# Patient Record
Sex: Female | Born: 1940 | Race: White | Hispanic: No | Marital: Married | Smoking: Former smoker
Health system: Southern US, Community
[De-identification: ages and names within clinical notes are randomized; demographics above are authoritative.]

## PROBLEM LIST (undated history)

## (undated) DIAGNOSIS — R0600 Dyspnea, unspecified: Secondary | ICD-10-CM

## (undated) DIAGNOSIS — R000 Tachycardia, unspecified: Secondary | ICD-10-CM

## (undated) DIAGNOSIS — K219 Gastro-esophageal reflux disease without esophagitis: Secondary | ICD-10-CM

## (undated) DIAGNOSIS — Z9981 Dependence on supplemental oxygen: Secondary | ICD-10-CM

## (undated) DIAGNOSIS — J449 Chronic obstructive pulmonary disease, unspecified: Secondary | ICD-10-CM

## (undated) HISTORY — PX: APPENDECTOMY: SHX54

## (undated) HISTORY — PX: PARTIAL HYSTERECTOMY: SHX80

## (undated) HISTORY — PX: BACK SURGERY: SHX140

## (undated) HISTORY — PX: OTHER SURGICAL HISTORY: SHX169

## (undated) HISTORY — DX: Chronic obstructive pulmonary disease, unspecified: J44.9

## (undated) HISTORY — DX: Gastro-esophageal reflux disease without esophagitis: K21.9

## (undated) HISTORY — PX: FOOT SURGERY: SHX648

## (undated) HISTORY — DX: Tachycardia, unspecified: R00.0

---

## 1997-11-29 ENCOUNTER — Other Ambulatory Visit: Admission: RE | Admit: 1997-11-29 | Discharge: 1997-11-29 | Payer: Self-pay | Admitting: Family Medicine

## 1999-11-26 ENCOUNTER — Other Ambulatory Visit: Admission: RE | Admit: 1999-11-26 | Discharge: 1999-11-26 | Payer: Self-pay | Admitting: Family Medicine

## 2000-04-27 ENCOUNTER — Ambulatory Visit (HOSPITAL_COMMUNITY): Admission: RE | Admit: 2000-04-27 | Discharge: 2000-04-27 | Payer: Self-pay | Admitting: *Deleted

## 2000-04-27 ENCOUNTER — Encounter (INDEPENDENT_AMBULATORY_CARE_PROVIDER_SITE_OTHER): Payer: Self-pay | Admitting: Specialist

## 2001-01-08 ENCOUNTER — Encounter: Admission: RE | Admit: 2001-01-08 | Discharge: 2001-04-08 | Payer: Self-pay | Admitting: Orthopaedic Surgery

## 2002-01-06 ENCOUNTER — Encounter (HOSPITAL_COMMUNITY): Admission: RE | Admit: 2002-01-06 | Discharge: 2002-02-05 | Payer: Self-pay | Admitting: Rheumatology

## 2002-01-06 ENCOUNTER — Encounter: Payer: Self-pay | Admitting: Rheumatology

## 2002-02-17 ENCOUNTER — Encounter (HOSPITAL_COMMUNITY): Admission: RE | Admit: 2002-02-17 | Discharge: 2002-03-19 | Payer: Self-pay | Admitting: Rheumatology

## 2002-08-12 ENCOUNTER — Ambulatory Visit (HOSPITAL_COMMUNITY): Admission: RE | Admit: 2002-08-12 | Discharge: 2002-08-12 | Payer: Self-pay | Admitting: *Deleted

## 2002-08-12 ENCOUNTER — Encounter (INDEPENDENT_AMBULATORY_CARE_PROVIDER_SITE_OTHER): Payer: Self-pay | Admitting: Specialist

## 2003-05-13 ENCOUNTER — Encounter: Admission: RE | Admit: 2003-05-13 | Discharge: 2003-05-13 | Payer: Self-pay | Admitting: Orthopaedic Surgery

## 2003-06-08 ENCOUNTER — Encounter: Admission: RE | Admit: 2003-06-08 | Discharge: 2003-06-08 | Payer: Self-pay | Admitting: Orthopaedic Surgery

## 2003-06-27 ENCOUNTER — Encounter: Admission: RE | Admit: 2003-06-27 | Discharge: 2003-07-28 | Payer: Self-pay | Admitting: Orthopaedic Surgery

## 2003-06-28 ENCOUNTER — Encounter: Admission: RE | Admit: 2003-06-28 | Discharge: 2003-06-28 | Payer: Self-pay | Admitting: Orthopaedic Surgery

## 2003-07-17 ENCOUNTER — Encounter: Admission: RE | Admit: 2003-07-17 | Discharge: 2003-07-17 | Payer: Self-pay | Admitting: Orthopaedic Surgery

## 2003-10-17 ENCOUNTER — Other Ambulatory Visit: Admission: RE | Admit: 2003-10-17 | Discharge: 2003-10-17 | Payer: Self-pay | Admitting: Dermatology

## 2004-01-11 ENCOUNTER — Ambulatory Visit: Payer: Self-pay | Admitting: Family Medicine

## 2004-01-12 ENCOUNTER — Ambulatory Visit (HOSPITAL_COMMUNITY): Admission: RE | Admit: 2004-01-12 | Discharge: 2004-01-12 | Payer: Self-pay | Admitting: Family Medicine

## 2004-01-15 ENCOUNTER — Ambulatory Visit (HOSPITAL_COMMUNITY): Admission: RE | Admit: 2004-01-15 | Discharge: 2004-01-15 | Payer: Self-pay | Admitting: Family Medicine

## 2004-02-13 ENCOUNTER — Ambulatory Visit: Payer: Self-pay | Admitting: Family Medicine

## 2004-02-29 ENCOUNTER — Ambulatory Visit: Payer: Self-pay | Admitting: Family Medicine

## 2004-03-05 ENCOUNTER — Ambulatory Visit: Payer: Self-pay | Admitting: Family Medicine

## 2004-05-03 ENCOUNTER — Ambulatory Visit: Payer: Self-pay | Admitting: Family Medicine

## 2004-05-07 ENCOUNTER — Ambulatory Visit: Payer: Self-pay | Admitting: Family Medicine

## 2004-07-30 ENCOUNTER — Ambulatory Visit: Payer: Self-pay | Admitting: Family Medicine

## 2004-08-13 ENCOUNTER — Ambulatory Visit: Payer: Self-pay | Admitting: Family Medicine

## 2004-10-22 ENCOUNTER — Ambulatory Visit: Payer: Self-pay | Admitting: Family Medicine

## 2004-12-30 ENCOUNTER — Ambulatory Visit: Payer: Self-pay | Admitting: Family Medicine

## 2005-01-28 ENCOUNTER — Ambulatory Visit (HOSPITAL_COMMUNITY): Admission: RE | Admit: 2005-01-28 | Discharge: 2005-01-28 | Payer: Self-pay | Admitting: Family Medicine

## 2005-03-05 ENCOUNTER — Ambulatory Visit: Payer: Self-pay | Admitting: Family Medicine

## 2005-05-01 ENCOUNTER — Ambulatory Visit: Payer: Self-pay | Admitting: Family Medicine

## 2005-06-24 ENCOUNTER — Ambulatory Visit: Payer: Self-pay | Admitting: Family Medicine

## 2005-07-23 ENCOUNTER — Ambulatory Visit: Payer: Self-pay | Admitting: Family Medicine

## 2005-09-09 ENCOUNTER — Ambulatory Visit: Payer: Self-pay | Admitting: Family Medicine

## 2005-10-07 ENCOUNTER — Ambulatory Visit: Payer: Self-pay | Admitting: Family Medicine

## 2005-12-15 ENCOUNTER — Ambulatory Visit: Payer: Self-pay | Admitting: Family Medicine

## 2006-01-30 ENCOUNTER — Ambulatory Visit (HOSPITAL_COMMUNITY): Admission: RE | Admit: 2006-01-30 | Discharge: 2006-01-30 | Payer: Self-pay | Admitting: Family Medicine

## 2006-02-05 ENCOUNTER — Ambulatory Visit: Payer: Self-pay | Admitting: Family Medicine

## 2006-03-17 ENCOUNTER — Ambulatory Visit: Payer: Self-pay | Admitting: Family Medicine

## 2006-03-26 ENCOUNTER — Ambulatory Visit: Payer: Self-pay | Admitting: Family Medicine

## 2006-04-16 ENCOUNTER — Ambulatory Visit: Payer: Self-pay | Admitting: Family Medicine

## 2006-07-01 ENCOUNTER — Ambulatory Visit: Payer: Self-pay | Admitting: Family Medicine

## 2006-07-02 ENCOUNTER — Encounter: Payer: Self-pay | Admitting: Cardiology

## 2006-07-08 ENCOUNTER — Encounter: Payer: Self-pay | Admitting: Cardiology

## 2006-10-12 ENCOUNTER — Encounter: Admission: RE | Admit: 2006-10-12 | Discharge: 2006-11-09 | Payer: Self-pay | Admitting: Neurosurgery

## 2007-02-01 ENCOUNTER — Ambulatory Visit (HOSPITAL_COMMUNITY): Admission: RE | Admit: 2007-02-01 | Discharge: 2007-02-01 | Payer: Self-pay | Admitting: Family Medicine

## 2007-06-30 ENCOUNTER — Encounter: Payer: Self-pay | Admitting: Cardiology

## 2007-08-04 ENCOUNTER — Ambulatory Visit: Payer: Self-pay | Admitting: Cardiology

## 2007-08-05 ENCOUNTER — Encounter: Payer: Self-pay | Admitting: Cardiology

## 2007-08-24 ENCOUNTER — Ambulatory Visit: Payer: Self-pay | Admitting: Cardiology

## 2008-10-16 DIAGNOSIS — R0989 Other specified symptoms and signs involving the circulatory and respiratory systems: Secondary | ICD-10-CM | POA: Insufficient documentation

## 2008-10-17 ENCOUNTER — Encounter: Payer: Self-pay | Admitting: Cardiology

## 2008-10-17 ENCOUNTER — Ambulatory Visit: Payer: Self-pay | Admitting: Cardiology

## 2008-10-17 DIAGNOSIS — I10 Essential (primary) hypertension: Secondary | ICD-10-CM | POA: Insufficient documentation

## 2008-10-17 DIAGNOSIS — G4700 Insomnia, unspecified: Secondary | ICD-10-CM | POA: Insufficient documentation

## 2008-10-17 DIAGNOSIS — J449 Chronic obstructive pulmonary disease, unspecified: Secondary | ICD-10-CM | POA: Insufficient documentation

## 2008-10-17 DIAGNOSIS — F411 Generalized anxiety disorder: Secondary | ICD-10-CM | POA: Insufficient documentation

## 2008-10-17 DIAGNOSIS — R0602 Shortness of breath: Secondary | ICD-10-CM | POA: Insufficient documentation

## 2008-10-25 ENCOUNTER — Ambulatory Visit: Payer: Self-pay | Admitting: Cardiology

## 2008-11-15 ENCOUNTER — Ambulatory Visit: Payer: Self-pay | Admitting: Cardiology

## 2010-05-13 ENCOUNTER — Other Ambulatory Visit: Payer: Self-pay | Admitting: Neurosurgery

## 2010-05-13 DIAGNOSIS — M47816 Spondylosis without myelopathy or radiculopathy, lumbar region: Secondary | ICD-10-CM

## 2010-05-15 ENCOUNTER — Ambulatory Visit
Admission: RE | Admit: 2010-05-15 | Discharge: 2010-05-15 | Disposition: A | Discharge status: Home / Self Care | Payer: Medicare Other | Source: Ambulatory Visit | Attending: Neurosurgery | Admitting: Neurosurgery

## 2010-05-15 DIAGNOSIS — M47816 Spondylosis without myelopathy or radiculopathy, lumbar region: Secondary | ICD-10-CM

## 2010-07-16 NOTE — Assessment & Plan Note (Signed)
[facility]                          [facility] OFFICE NOTE   DAILEY, ALBERSON                      MRN:          [id number]  DATE:08/24/2007                            DOB:          1941/01/01    REASON FOR CONSULTATION:  Evaluation of rapid heartbeat.   HISTORY OF PRESENT ILLNESS:  The patient is a 70 year old female with no  prior history of heart disease.  The patient was hospitalized on July 01, 2007, with right back and flank pain.  She was evaluated by Dr.  Doyne Keel.  She was felt to be at low risk for pulmonary embolism.  It was  felt that the patient had musculoskeletal pain; however, she was noted  to have a 10-beat run of wide complex tachycardia albeit asymptomatic.  A CardioNet monitor was prescribed as an outpatient.  It was reviewed  and was essentially within normal limits.  The patient denies any  substernal chest pain.  She does have multiple risk factors of coronary  artery disease particularly tobacco use.  The patient also ruled out for  myocardial infarction.  The patient currently denies any substernal  chest pain, shortness of breath, orthopnea, or PND.  She appears to be  asymptomatic although she does have a history of COPD and reports some  dyspnea on moderate exertion.   ALLERGIES:  Multiple including CODEINE, IODINE CONTRAST causes rash,  CIPRO cause rash, MORPHINE, PENICILLIN, SULFA and TRAMADOL all of which  causes rash.   MEDICATIONS:  1. Advair 250 mcg p.o. daily.  2. Zyrtec over-the-counter.  3. Prilosec 20 mg over-the-counter.   PAST MEDICAL HISTORY:  1. History of hysterectomy.  2. Cervical disk surgery.  3. Bilateral arthroscopic knee surgery.  4. ORIF for malleolar fracture of the right ankle last September.  5. Gastroesophageal reflux disease.   SOCIAL HISTORY:  The patient is married.  She still smokes 1/2-pack a  day.  She denies any alcohol use.  She remains physically active.   FAMILY  HISTORY:  Negative for premature coronary arteries disease,  although her mother and father had heart disease at age 5 and 65  respectively.  She has several siblings with no premature coronary  artery disease.   CURRENT MEDICATIONS:  Listed as above.   REVIEW OF SYSTEMS:  The patient denies any nausea or vomiting.  No fever  or chills.  No melena or hematochezia.  No dysuria or frequency.  No  palpitations or syncope.  Remainder of review of systems is negative.   PHYSICAL EXAMINATION:  VITAL SIGNS:  Blood pressure 136/80, heart rate  is 81 beats per minute, and weight 156 pounds.  GENERAL:  Well-nourished white female, in no apparent distress.  HEENT:  Pupils are equal.  Conjunctivae clear.  NECK:  Supple.  Normal carotid upstroke and no carotid bruits.  LUNGS:  Clear breath sounds, although slightly diminished at the bases.  HEART:  Regular rate and rhythm with normal S1 and S2.  No murmur, rubs,  or gallops.  ABDOMEN:  Soft and nontender.  No rebound or guarding.  Good bowel  sounds.  EXTREMITIES:  No cyanosis, clubbing, or edema.  NEURO:  The patient is alert and oriented and grossly nonfocal.   PROBLEM LIST:  1. __________ asymptomatic of wide complex tachycardia..  2. Negative CardioNet monitor.  3. History of tobacco use.  4. History of asthma/chronic obstructive pulmonary disease.   PLAN:  1. At this point, the patient is asymptomatic.  I did discuss with her      therapeutic lifestyle changes and risk factor reduction.  The      patient should take an aspirin daily.  2. The patient had planned to do an echocardiographic study and is      also not interested in pursuing a stress test, which I do think is      unreasonable to hold off.  She did have either a catheterization or      stress test done about 5 years ago in [place].  We will try to      obtain those results.  3. I reviewed the patient's CardioNet monitoring.  There is no      evidence of significant  dysrhythmia.  No further therapy needs to      be initiated.     Learta Codding, MD,FACC  Electronically Signed    GED/MedQ  DD: 08/24/2007  DT: 08/25/2007  Job #: [id number]

## 2010-07-19 NOTE — Consult Note (Signed)
   NAMEJASHAWNA, [name] NO.:  [id number]   MEDICAL RECORD NO.:  [id number]                  PATIENT TYPE:   LOCATION:                                       FACILITY:   PHYSICIAN:  Aundra Dubin, M.D.            DATE OF BIRTH:   DATE OF CONSULTATION:  01/20/2002  DATE OF DISCHARGE:                                   CONSULTATION   CHIEF COMPLAINT:  Left shoulder.   HISTORY OF PRESENT ILLNESS:  I spoke with Dr. Dewaine Conger two days ago when the  patient was in his office complaining about her left shoulder.  It was quite  flared up and she tells me today that she had been doing some raking over  the weekend and this may have flared it up.   PHYSICAL EXAMINATION:  MUSCULOSKELETAL:  She is quite tender to the left  shoulder.  She has limited abduction to 90 degrees.  Internal and external  rotation is limited.   PROCEDURE:  Left shoulder injection.  The skin was marked, then cleaned with  Betadine and alcohol swabs and cooled with ethyl chloride and 80 mg of Depo-  Medrol and 0.75 cc of 2% lidocaine was injected.   ASSESSMENT AND PLAN:  Left shoulder tendonitis.  The shoulder was x-rayed on  January 06, 2002 and the impression was normal study.  I suspect that she  has a tendonitis and may have aggravated it with raking, which I have  encouraged her to not do for several weeks.   She will return for evaluation of this problem and others on February 17, 2002.                                               Aundra Dubin, M.D.    WWT/MEDQ  D:  01/20/2002  T:  01/20/2002  Job:  [id number]   cc:   Colon Flattery  [address]  [place]  [place] [address]  Fax: [phone]

## 2010-07-19 NOTE — Consult Note (Signed)
NAME:  [name], [name] NO.:  [id number]   MEDICAL RECORD NO.:  [id number]                   PATIENT TYPE:   LOCATION:                                       FACILITY:   PHYSICIAN:  Aundra Dubin, M.D.            DATE OF BIRTH:   DATE OF CONSULTATION:  DATE OF DISCHARGE:                                   CONSULTATION   CHIEF COMPLAINT:  Left shoulder, hands, and right knee.   HISTORY OF PRESENT ILLNESS:  The patient is a 70 year old white female with  several varied musculoskeletal problems.  Her worse problem today is her  left shoulder, which she cannot lift.  This hurts a great deal and this is  interfering with her sleep.  This has hurt very severely for about 1-2 weeks  and she was only dusting when the pain began.  Her elbows have hurt for a  long time.  Her right knee chronically aches and is sore.  She has had both  knees operated on in the past and has been told has been told she has  arthritis.  Her hands ache a great deal and she feels that they are sore and  she has decreased grip strength.  She will soak the fingers in cold water so  that she can remove her rings.  She is stiff in the mornings but I am not  sure for how long.  She has tried Vioxx but this bothers her stomach  severely.   REVIEW OF SYSTEMS:  There has been no fever, rashes, weight loss, psoriasis,  photosensitivity, oral ulcers, alopecia, pleurisy, or Raynaud's.  She has  headaches about twice a week which are not severe.  Tylenol helps this.  Her  sleep is generally non-restorative but is worse recently because of the left  shoulder pain.  She denies diarrhea, constipation, blood or mucous to the  bowel movement or chest pain.  She has shortness of breath from smoking and  asthma.  Her backs aches a great deal with standing.  Further review of  systems was negative.   PAST MEDICAL HISTORY:   PAST SURGICAL HISTORY:  1. Cervical surgery 1992.  2. Left knee  arthroscopy 1997.  3. Right knee arthroscopy 2002.  4. Partial hysterectomy 1982.  5. Osteoarthritis.  6. Asthma.  7. GERD.   CURRENT MEDICATIONS:  1. Allegra 180 mg q.d.  2. Singulair 10 mg q.d.  3. Albuterol p.r.n.  4. Allergy shots weekly.   ALLERGIES:  1. Mycins.  2. Codeine.  3. Morphine.  4. Iodine.  5. Sulfa drugs.  6. Advil.  7. Aleve.   FAMILY HISTORY:  Her father died at age 38 and her mother died at age 59,  both with heart problems.   SOCIAL HISTORY:  She is originally from [place] and has lived in [place] for  20 years.  She is married and lives with  her husband.  She has 2 grown sons.  She completed the 10th grade.  She worked at [employer] for 20 years and has  not worked since about 1997.  She began smoking at about age 68 and smokes a  pack of cigarettes a day.  Rare alcohol.   PHYSICAL EXAMINATION:  VITAL SIGNS: Weight 146 pounds.  Height 5 feet 4  inches.  Blood pressure 130/70, respirations 18.  GENERAL: She appears tired.  She is in no distress.  SKIN: Sallow secondary to long term cigarette smoking.  Otherwise clear.  HEENT: PERRL/EOMI.  She has moderately dense lenses.  The mouth dentures.  No obvious ulcers or petechia.  NECK: Negative JVD.  Normal thyroid.  LUNGS: Quite sounds with mild rhonchi.  HEART: Regular with no murmur.  ABDOMEN: Mildly tender.  Negative HSM.  MUSCULOSKELETAL: The hands show slight nodularity to the PIPs and they are  palpable.  The PIPs and MCPs have mild tenderness but no over swelling.  The  wrists, elbows, and right shoulder have good range of motion and are  nontender.  The left shoulder shows reduction in internal and external  rotation and she can only abduct the shoulder to about 85 degrees.  This is  quite tender.  Trigger points at the elbow, shoulder, neck, __________  chest, upper paraspinous muscles, and low back are all tender.  Hips with  good range of motion.  Right knee is mildly warm.  Slightly  hypertrophic and  has fairly good range of motion with some tenderness beyond 130 degrees  flexion.  The left knee is cool and nontender with flexion to 135 degrees.  The ankles were mildly tender as were the feet but there was no acute  arthritis swelling.  NEUROLOGIC: The right deltoid is 5/5.  I was not able to adequately test the  left side.  Thigh muscles are 5/5.  DTRs are 2+ throughout.  Negative SLRs.   PROCEDURE:  Left shoulder injection.  Lateral approach.  The skin and  cleansed with Betadine and alcohol swabs and cooled with ethyl chloride.  Then 80 mg of Depo-Medrol and 1 cc of 2% lidocaine was injected.   ASSESSMENT:   PLAN:  1. Left shoulder pain.  I suspect that she has a tendonitis.  We will have     this shoulder x-rayed.  2. Knee pain.  This is worse on the right and we will have this x-rayed.     For all of her arthralgias I am going to try her on Relafen 500 mg-1000     mg q.d.  She will also take Darvocet at b.i.d.-t.i.d. p.r.n. dose.  I     discussed that Relafen can bother her stomach.  I have asked her to take     the Prilosec on a regular basis which she generally has been taking on a     p.r.n. basis.  3. Hand pain.  We will x-ray the hands.  She likely has OA to the hands and     knees.  4. Insomnia.  I have asked her to take one of the Darvocet to help with the     sleep.  She also had some positive trigger points.  5. History of neck surgery.  6. Hysterectomy.   COMMENT:  Gery Pray, I believe this patient does have OA particularly to the  knees and hands and we will evaluate this by the x-rays.  I think she has an  acute tendinitis or bursitis  going on with the left shoulder.  She was  having some pain relief before leaving the office from the injection of  lidocaine.  I will see her back in about 1 month.  Thank you.                                                Aundra Dubin, M.D.   WWT/MEDQ  D:  01/06/2002  T:  01/07/2002  Job:  [id number]    cc:   Colon Flattery, D.O.  [address]  [place]. [phone]

## 2010-07-19 NOTE — Consult Note (Signed)
NAME:  [name], [name]                         ACCOUNT NO.:  [id number]   MEDICAL RECORD NO.:  [id number]                   PATIENT TYPE:   LOCATION:                                       FACILITY:   PHYSICIAN:  Aundra Dubin, M.D.            DATE OF BIRTH:   DATE OF CONSULTATION:  02/17/2002  DATE OF DISCHARGE:                                   CONSULTATION   CHIEF COMPLAINT:  Left shoulder, hands, knees.   HISTORY OF PRESENT ILLNESS:  The patient returns reporting that after the  second injection her shoulder is considerably better.  She is not being  awakened at night with this as often and she finds the Darvocet helps her  sleep.  She feels that the hands and knee pain is significantly improved.  She is only using 500 mg of Relafen each day.  There have been no swollen  joints.  She has had some cough recently related with her smoking.   Labs checked on January 06, 2002 showed a negative RF and a TSH was 0.67  (0.35-5.5).  The WBC was 11.3, HGB 14.7, PLT 337, glucose 29, creatinine  0.7, albumin 4.0, AST 21.  The x-ray report of the right hand discusses a  small erosion to the right middle phalanx of the second finger.  I have  reviewed this and do not find that she has an erosion.  She has very minor  degenerative-type joint narrowing of the DIPs and PIPs.  I reviewed her left  shoulder x-ray which is normal.  The right knee x-ray has minor degenerative-  type changes.   Past medical history was reviewed and unchanged from January 06, 2002.   MEDICATIONS:  1. Relafen 500 mg q.d.  2. Darvocet-N 100 q.d.  3. Albuterol inhaler q.d.  4. Singulair 10 mg q.d.  5. Allegra 180 mg q.d.  6. Allergy shots.  7. Levaquin.  8. Cough syrup.   PHYSICAL EXAMINATION:  VITAL SIGNS:  Weight 144 pounds, blood pressure  120/80, respirations 16.  GENERAL:  She has a slight cough and sniffles.  LUNGS:  Clear.  NECK:  Negative JVD.  BACK:  Nontender.  HEART:  Regular, no murmur.  LOWER EXTREMITIES:  No edema.  MUSCULOSKELETAL:  The hands show no significant active arthritis.  These  areas are cool and nontender.  Wrists, elbows, and right shoulder with good  range of motion.  The left shoulder still has difficult internal and  external rotation which is slightly limited.  Abduction is better, with  limitations to 130 degrees.  Knees are cool and flex easily without  tenderness.  The ankles and feet were nontender.    ASSESSMENT AND PLAN:  1. Left shoulder tendonitis.  This is improved with the second injection.  I     will have her go for physical therapy to learn a range of motion that I  have encouraged her to do at home for 10-15 minutes most days.  She will     continue with the Darvocet at night.  2. Hand and knee pain.  She may have a minor degree of arthritis to these     areas.  The Relafen and Darvocet are helping and she will continue on     these as she has been taking.   If these arthralgias worsen or if she is not improving with the shoulder  tendonitis, she will call for a return appointment.  I will see her back on  a p.r.n. basis.                                               Aundra Dubin, M.D.    WWT/MEDQ  D:  02/17/2002  T:  02/18/2002  Job:  [id number]   cc:   Colon Flattery  [address]  [place]  [place] [address]  Fax: [phone]

## 2010-10-03 ENCOUNTER — Other Ambulatory Visit: Payer: Self-pay | Admitting: Neurosurgery

## 2010-10-03 DIAGNOSIS — M47816 Spondylosis without myelopathy or radiculopathy, lumbar region: Secondary | ICD-10-CM

## 2010-10-04 ENCOUNTER — Ambulatory Visit
Admission: RE | Admit: 2010-10-04 | Discharge: 2010-10-04 | Disposition: A | Discharge status: Home / Self Care | Payer: Medicare Other | Source: Ambulatory Visit | Attending: Neurosurgery | Admitting: Neurosurgery

## 2010-10-04 VITALS — BP 126/70 | HR 69

## 2010-10-04 DIAGNOSIS — M47816 Spondylosis without myelopathy or radiculopathy, lumbar region: Secondary | ICD-10-CM

## 2010-10-04 MED ORDER — IOHEXOL 180 MG/ML  SOLN
1.0000 mL | Freq: Once | INTRAMUSCULAR | Status: AC | PRN
  Administered 2010-10-04: 1 mL via EPIDURAL

## 2010-10-04 MED ORDER — METHYLPREDNISOLONE ACETATE 40 MG/ML INJ SUSP (RADIOLOG
120.0000 mg | Freq: Once | INTRAMUSCULAR | Status: AC
  Administered 2010-10-04: 120 mg via EPIDURAL

## 2010-10-08 ENCOUNTER — Other Ambulatory Visit: Payer: Self-pay | Admitting: Neurosurgery

## 2010-10-08 DIAGNOSIS — M47816 Spondylosis without myelopathy or radiculopathy, lumbar region: Secondary | ICD-10-CM

## 2010-10-17 ENCOUNTER — Ambulatory Visit
Admission: RE | Admit: 2010-10-17 | Discharge: 2010-10-17 | Disposition: A | Discharge status: Home / Self Care | Payer: Medicare Other | Source: Ambulatory Visit | Attending: Neurosurgery | Admitting: Neurosurgery

## 2010-10-17 DIAGNOSIS — M47816 Spondylosis without myelopathy or radiculopathy, lumbar region: Secondary | ICD-10-CM

## 2010-10-17 MED ORDER — IOHEXOL 180 MG/ML  SOLN
1.0000 mL | Freq: Once | INTRAMUSCULAR | Status: AC | PRN
  Administered 2010-10-17: 1 mL via EPIDURAL

## 2010-10-17 MED ORDER — METHYLPREDNISOLONE ACETATE 40 MG/ML INJ SUSP (RADIOLOG
120.0000 mg | Freq: Once | INTRAMUSCULAR | Status: AC
  Administered 2010-10-17: 120 mg via EPIDURAL

## 2011-09-26 ENCOUNTER — Encounter: Payer: Self-pay | Admitting: *Deleted

## 2012-04-30 ENCOUNTER — Encounter (INDEPENDENT_AMBULATORY_CARE_PROVIDER_SITE_OTHER): Payer: Self-pay | Admitting: Ophthalmology

## 2012-05-03 ENCOUNTER — Encounter (INDEPENDENT_AMBULATORY_CARE_PROVIDER_SITE_OTHER): Payer: Self-pay | Admitting: Ophthalmology

## 2012-05-10 ENCOUNTER — Encounter (INDEPENDENT_AMBULATORY_CARE_PROVIDER_SITE_OTHER): Payer: Self-pay | Admitting: Ophthalmology

## 2012-05-10 ENCOUNTER — Encounter (INDEPENDENT_AMBULATORY_CARE_PROVIDER_SITE_OTHER): Payer: Medicare Other | Admitting: Ophthalmology

## 2012-05-10 DIAGNOSIS — H35379 Puckering of macula, unspecified eye: Secondary | ICD-10-CM

## 2012-05-10 DIAGNOSIS — H43819 Vitreous degeneration, unspecified eye: Secondary | ICD-10-CM

## 2012-05-10 DIAGNOSIS — I10 Essential (primary) hypertension: Secondary | ICD-10-CM

## 2012-05-10 DIAGNOSIS — H3530 Unspecified macular degeneration: Secondary | ICD-10-CM

## 2012-05-10 DIAGNOSIS — H2510 Age-related nuclear cataract, unspecified eye: Secondary | ICD-10-CM

## 2012-05-10 DIAGNOSIS — H35039 Hypertensive retinopathy, unspecified eye: Secondary | ICD-10-CM

## 2013-12-12 ENCOUNTER — Ambulatory Visit: Payer: BLUE CROSS/BLUE SHIELD | Attending: Neurosurgery | Admitting: Physical Therapy

## 2013-12-12 DIAGNOSIS — I10 Essential (primary) hypertension: Secondary | ICD-10-CM | POA: Insufficient documentation

## 2013-12-12 DIAGNOSIS — M545 Low back pain: Secondary | ICD-10-CM | POA: Diagnosis not present

## 2013-12-12 DIAGNOSIS — R5381 Other malaise: Secondary | ICD-10-CM | POA: Diagnosis not present

## 2013-12-12 DIAGNOSIS — Z5189 Encounter for other specified aftercare: Secondary | ICD-10-CM | POA: Insufficient documentation

## 2013-12-12 DIAGNOSIS — M4316 Spondylolisthesis, lumbar region: Secondary | ICD-10-CM | POA: Diagnosis not present

## 2013-12-15 ENCOUNTER — Ambulatory Visit: Payer: BLUE CROSS/BLUE SHIELD | Admitting: Physical Therapy

## 2013-12-15 DIAGNOSIS — Z5189 Encounter for other specified aftercare: Secondary | ICD-10-CM | POA: Diagnosis not present

## 2013-12-19 ENCOUNTER — Ambulatory Visit: Payer: BLUE CROSS/BLUE SHIELD | Admitting: Physical Therapy

## 2013-12-19 DIAGNOSIS — Z5189 Encounter for other specified aftercare: Secondary | ICD-10-CM | POA: Diagnosis not present

## 2013-12-21 ENCOUNTER — Ambulatory Visit: Payer: BLUE CROSS/BLUE SHIELD | Admitting: Physical Therapy

## 2013-12-21 DIAGNOSIS — Z5189 Encounter for other specified aftercare: Secondary | ICD-10-CM | POA: Diagnosis not present

## 2013-12-22 ENCOUNTER — Encounter: Payer: Self-pay | Admitting: Physical Therapy

## 2013-12-26 ENCOUNTER — Ambulatory Visit: Payer: BLUE CROSS/BLUE SHIELD | Admitting: Physical Therapy

## 2013-12-26 DIAGNOSIS — Z5189 Encounter for other specified aftercare: Secondary | ICD-10-CM | POA: Diagnosis not present

## 2013-12-29 ENCOUNTER — Ambulatory Visit: Payer: BLUE CROSS/BLUE SHIELD | Admitting: Physical Therapy

## 2013-12-29 DIAGNOSIS — Z5189 Encounter for other specified aftercare: Secondary | ICD-10-CM | POA: Diagnosis not present

## 2014-01-02 ENCOUNTER — Ambulatory Visit: Payer: Self-pay | Admitting: Physical Therapy

## 2014-01-05 ENCOUNTER — Encounter: Payer: Self-pay | Admitting: Physical Therapy

## 2014-01-09 ENCOUNTER — Encounter: Payer: Self-pay | Admitting: Physical Therapy

## 2014-01-09 ENCOUNTER — Ambulatory Visit: Payer: BLUE CROSS/BLUE SHIELD | Attending: Neurosurgery | Admitting: Physical Therapy

## 2014-01-09 DIAGNOSIS — I10 Essential (primary) hypertension: Secondary | ICD-10-CM | POA: Insufficient documentation

## 2014-01-09 DIAGNOSIS — M4316 Spondylolisthesis, lumbar region: Secondary | ICD-10-CM | POA: Diagnosis not present

## 2014-01-09 DIAGNOSIS — R5381 Other malaise: Secondary | ICD-10-CM | POA: Insufficient documentation

## 2014-01-09 DIAGNOSIS — M545 Low back pain: Secondary | ICD-10-CM | POA: Diagnosis not present

## 2014-01-09 DIAGNOSIS — Z5189 Encounter for other specified aftercare: Secondary | ICD-10-CM | POA: Diagnosis present

## 2014-01-12 ENCOUNTER — Encounter: Payer: Self-pay | Admitting: Physical Therapy

## 2014-01-12 ENCOUNTER — Ambulatory Visit: Payer: BLUE CROSS/BLUE SHIELD | Admitting: Physical Therapy

## 2014-01-12 DIAGNOSIS — Z5189 Encounter for other specified aftercare: Secondary | ICD-10-CM | POA: Diagnosis not present

## 2014-01-16 ENCOUNTER — Ambulatory Visit: Payer: BLUE CROSS/BLUE SHIELD | Admitting: Physical Therapy

## 2014-01-16 DIAGNOSIS — Z5189 Encounter for other specified aftercare: Secondary | ICD-10-CM | POA: Diagnosis not present

## 2014-01-18 ENCOUNTER — Ambulatory Visit: Payer: BLUE CROSS/BLUE SHIELD | Admitting: Physical Therapy

## 2014-01-18 DIAGNOSIS — Z5189 Encounter for other specified aftercare: Secondary | ICD-10-CM | POA: Diagnosis not present

## 2014-01-23 ENCOUNTER — Ambulatory Visit: Payer: BLUE CROSS/BLUE SHIELD | Admitting: Physical Therapy

## 2014-01-23 DIAGNOSIS — Z5189 Encounter for other specified aftercare: Secondary | ICD-10-CM | POA: Diagnosis not present

## 2014-01-25 ENCOUNTER — Ambulatory Visit: Payer: BLUE CROSS/BLUE SHIELD | Admitting: Physical Therapy

## 2014-01-25 DIAGNOSIS — Z5189 Encounter for other specified aftercare: Secondary | ICD-10-CM | POA: Diagnosis not present

## 2015-10-16 ENCOUNTER — Ambulatory Visit (INDEPENDENT_AMBULATORY_CARE_PROVIDER_SITE_OTHER): Payer: Medicare Other | Admitting: Allergy and Immunology

## 2015-10-16 ENCOUNTER — Encounter: Payer: Self-pay | Admitting: Allergy and Immunology

## 2015-10-16 VITALS — BP 138/70 | HR 100 | Temp 97.8°F | Resp 20 | Ht 62.0 in | Wt 151.6 lb

## 2015-10-16 DIAGNOSIS — H1010 Acute atopic conjunctivitis, unspecified eye: Secondary | ICD-10-CM | POA: Insufficient documentation

## 2015-10-16 DIAGNOSIS — J3089 Other allergic rhinitis: Secondary | ICD-10-CM | POA: Diagnosis not present

## 2015-10-16 DIAGNOSIS — H1013 Acute atopic conjunctivitis, bilateral: Secondary | ICD-10-CM | POA: Diagnosis not present

## 2015-10-16 DIAGNOSIS — J449 Chronic obstructive pulmonary disease, unspecified: Secondary | ICD-10-CM | POA: Diagnosis not present

## 2015-10-16 MED ORDER — MOMETASONE FURO-FORMOTEROL FUM 200-5 MCG/ACT IN AERO: 2.0000 | INHALATION_SPRAY | Freq: Two times a day (BID) | RESPIRATORY_TRACT | 5 refills | Status: DC

## 2015-10-16 MED ORDER — AZELASTINE HCL 0.15 % NA SOLN: NASAL | 5 refills | Status: AC

## 2015-10-16 MED ORDER — OLOPATADINE HCL 0.7 % OP SOLN: 1.0000 [drp] | Freq: Every day | OPHTHALMIC | 5 refills | Status: DC | PRN

## 2015-10-16 NOTE — Patient Instructions (Addendum)
Perennial and seasonal allergic rhinitis  Aeroallergen avoidance measures have been discussed and provided in written form.  A prescription has been provided for azelastine nasal spray, 1-2 sprays per nostril 2 times daily as needed. Proper nasal spray technique has been discussed and demonstrated.   I have also recommended nasal saline spray (i.e., Simply Saline) or nasal saline lavage (i.e., NeilMed) as needed prior to medicated nasal sprays.  For now, continue montelukast 10 mg daily at bedtime.  Allergic conjunctivitis  Treatment plan as outlined above for allergic rhinitis.  A prescription has been provided for Pazeo, one drop per eye daily as needed.  I have also recommended eye lubricant drops (i.e., Natural Tears) as needed.  COPD (chronic obstructive pulmonary disease) ([place])  A prescription has been provided for [facility] (mometasone/formoterol) 200/5 g, 2 inhalations twice a day. Hold Advair for now.  To maximize pulmonary deposition, a spacer has been provided along with instructions for its proper administration with an HFA inhaler.  Continue albuterol every 4-6 hours as needed.  Continue tiotropium Juventino Slovak).   Return in about 2 months (around 12/16/2015), or if symptoms worsen or fail to improve.  Control of Mold Allergen  Mold and fungi can grow on a variety of surfaces provided certain temperature and moisture conditions exist.  Outdoor molds grow on plants, decaying vegetation and soil.  The major outdoor mold, Alternaria and Cladosporium, are found in very high numbers during hot and dry conditions.  Generally, a late Summer - Fall peak is seen for common outdoor fungal spores.  Rain will temporarily lower outdoor mold spore count, but counts rise rapidly when the rainy period ends.  The most important indoor molds are Aspergillus and Penicillium.  Dark, humid and poorly ventilated basements are ideal sites for mold growth.  The next most common sites of mold growth  are the bathroom and the kitchen.  Outdoor [employer] 1. Use air conditioning and keep windows closed 2. Avoid exposure to decaying vegetation. 3. Avoid leaf raking. 4. Avoid grain handling. 5. Consider wearing a face mask if working in moldy areas.  Indoor Mold Control 1. Maintain humidity below 50%. 2. Clean washable surfaces with 5% bleach solution. 3. Remove sources e.g. Contaminated carpets.  Reducing Pollen Exposure  The American Academy of Allergy, Asthma and Immunology suggests the following steps to reduce your exposure to pollen during allergy seasons.    1. Do not hang sheets or clothing out to dry; pollen may collect on these items. 2. Do not mow lawns or spend time around freshly cut grass; mowing stirs up pollen. 3. Keep windows closed at night.  Keep car windows closed while driving. 4. Minimize morning activities outdoors, a time when pollen counts are usually at their highest. 5. Stay indoors as much as possible when pollen counts or humidity is high and on windy days when pollen tends to remain in the air longer. 6. Use air conditioning when possible.  Many air conditioners have filters that trap the pollen spores. 7. Use a HEPA room air filter to remove pollen form the indoor air you breathe.

## 2015-10-16 NOTE — Assessment & Plan Note (Addendum)
   A prescription has been provided for [facility] (mometasone/formoterol) 200/5 g, 2 inhalations twice a day. Hold Advair for now.  To maximize pulmonary deposition, a spacer has been provided along with instructions for its proper administration with an HFA inhaler.  Continue albuterol every 4-6 hours as needed.  Continue tiotropium [name]).

## 2015-10-16 NOTE — Progress Notes (Signed)
New Patient Note  RE: [name] MRN: [id number] DOB: 08-24-1940 Date of Office Visit: 10/16/2015  Referring provider: No ref. provider found Primary care provider: Sherrie Mustache, MD  Chief Complaint: Allergy Testing   History of present illness: [name] is a 75 y.o. female presenting today for evaluation of rhinitis.  She complains of nasal congestion, rhinorrhea, sneezing "all the time", ocular pruritus, and sinus pressure.  These symptoms occur year around but tend to be more severe in the springtime and the fall.  Other triggers include damp weather and exposure to mold.  She estimates that she has 3 sinus infections requiring antibiotics per year on average.  She states that she had x-ray documented pneumonia this past year after having broken her ribs during a fall.  She is a former cigarette smoker having quit in 2012 with a 50-pack-year history.  She currently takes Advair 250/50 g, one inhalation twice a day, and Spiriva 18 mcg daily.  Despite compliance with these medications she requires albuterol rescue 1-2 times per day on average.  She denies nocturnal awakenings due to lower respiratory symptoms.   Assessment and plan: Perennial and seasonal allergic rhinitis  Aeroallergen avoidance measures have been discussed and provided in written form.  A prescription has been provided for azelastine nasal spray, 1-2 sprays per nostril 2 times daily as needed. Proper nasal spray technique has been discussed and demonstrated.   I have also recommended nasal saline spray (i.e., Simply Saline) or nasal saline lavage (i.e., NeilMed) as needed prior to medicated nasal sprays.  For now, continue montelukast 10 mg daily at bedtime.  Allergic conjunctivitis  Treatment plan as outlined above for allergic rhinitis.  A prescription has been provided for Pazeo, one drop per eye daily as needed.  I have also recommended eye lubricant drops (i.e., Natural Tears) as  needed.  COPD (chronic obstructive pulmonary disease) ([place])  A prescription has been provided for [facility] (mometasone/formoterol) 200/5 g, 2 inhalations twice a day. Hold Advair for now.  To maximize pulmonary deposition, a spacer has been provided along with instructions for its proper administration with an HFA inhaler.  Continue albuterol every 4-6 hours as needed.  Continue tiotropium [employer]).   Meds ordered this encounter  Medications  . Azelastine HCl 0.15 % SOLN    Sig: 1-2 sprays each nostril 2 times per day as needed.    Dispense:  30 mL    Refill:  5  . Olopatadine HCl (PAZEO) 0.7 % SOLN    Sig: Place 1 drop into both eyes daily as needed.    Dispense:  1 Bottle    Refill:  5  . mometasone-formoterol (DULERA) 200-5 MCG/ACT AERO    Sig: Inhale 2 puffs into the lungs 2 (two) times daily.    Dispense:  1 Inhaler    Refill:  5    Diagnositics: Spirometry: FVC was 1.59 L (64% predicted) and FEV1 was 0.81 L (42% predicted) without significant post bronchodilator improvement.  Moderate restrictive/obstructive pattern without significant bronchodilator response.  Please see scanned spirometry results for details. Epicutaneous testing: Positive to molds. Intradermal testing: Positive to grass pollens.   Physical examination: Blood pressure 138/70, pulse 100, temperature 97.8 F (36.6 C), temperature source Oral, resp. rate 20, height '5\' 2"'$  (1.575 m), weight 151 lb 9.6 oz (68.8 kg).  General: Alert, interactive, in no acute distress. HEENT: TMs pearly gray, turbinates moderately edematous without discharge, post-pharynx moderately erythematous. Neck: Supple without lymphadenopathy. Lungs: Mildly decreased breath sounds  bilaterally without wheezing, rhonchi or rales. CV: Normal S1, S2 without murmurs. Abdomen: Nondistended, nontender. Skin: Warm and dry, without lesions or rashes. Extremities:  No clubbing, cyanosis or edema. Neuro:   Grossly intact.  Review of  systems:  Review of systems negative except as noted in HPI / PMHx or noted below: Review of Systems  Constitutional: Negative.   HENT: Negative.   Eyes: Negative.   Respiratory: Negative.   Cardiovascular: Negative.   Gastrointestinal: Negative.   Genitourinary: Negative.   Musculoskeletal: Negative.   Skin: Negative.   Neurological: Negative.   Endo/Heme/Allergies: Negative.   Psychiatric/Behavioral: Negative.     Past medical history:  Past Medical History:  Diagnosis Date  . COPD (chronic obstructive pulmonary disease) ([place])   . GERD (gastroesophageal reflux disease)   . Tachycardia     Past surgical history:  Past Surgical History:  Procedure Laterality Date  . APPENDECTOMY    . arthroscopc     total knee  . PARTIAL HYSTERECTOMY    . ruptured disk      Family history: Family History  Problem Relation Age of Onset  . Coronary artery disease      Social history: Social History   Social History  . Marital status: Married    Spouse name: N/A  . Number of children: N/A  . Years of education: N/A   Occupational History  . Not on file.   Social History Main Topics  . Smoking status: Former Smoker    Quit date: 10/16/2011  . Smokeless tobacco: Never Used  . Alcohol use No  . Drug use: No  . Sexual activity: Not on file   Other Topics Concern  . Not on file   Social History Narrative  . No narrative on file   Environmental History: The patient lives in a house with hardwood floors throughout, kerosene heat, and window air conditioning units.  She is a former cigarette smoker having quit in 2012 with a 50-pack-year history.    Medication List       Accurate as of 10/16/15 12:29 PM. Always use your most recent med list.          ADVAIR DISKUS 250-50 MCG/DOSE Aepb Generic drug:  Fluticasone-Salmeterol INHALE ONE PUFF INTO LUNGS TWICE DAILY   albuterol 108 (90 Base) MCG/ACT inhaler Commonly known as:  PROVENTIL HFA;VENTOLIN HFA Inhale 2 puffs  into the lungs every 6 (six) hours as needed.   amLODipine 10 MG tablet Commonly known as:  NORVASC Take 10 mg by mouth daily.   Azelastine HCl 0.15 % Soln 1-2 sprays each nostril 2 times per day as needed.   Cetirizine HCl 10 MG Tbdp Take 10 mg by mouth daily.   FLUoxetine 20 MG capsule Commonly known as:  PROZAC Take 20 mg by mouth daily.   furosemide 20 MG tablet Commonly known as:  LASIX   gabapentin 300 MG capsule Commonly known as:  NEURONTIN Take 300 mg by mouth 2 (two) times daily.   ipratropium 0.02 % nebulizer solution Commonly known as:  ATROVENT Take 0.5 mg by nebulization 4 (four) times daily as needed for wheezing or shortness of breath.   mometasone-formoterol 200-5 MCG/ACT Aero Commonly known as:  DULERA Inhale 2 puffs into the lungs 2 (two) times daily.   montelukast 10 MG tablet Commonly known as:  SINGULAIR Take 10 mg by mouth at bedtime.   Olopatadine HCl 0.7 % Soln Commonly known as:  PAZEO Place 1 drop into both eyes daily as  needed.   pravastatin 80 MG tablet Commonly known as:  PRAVACHOL TAKE ONE TABLET BY MOUTH ONCE DAILY IN THE EVENING   SPIRIVA HANDIHALER 18 MCG inhalation capsule Generic drug:  tiotropium Place 18 mcg into inhaler and inhale daily.   TYLENOL ALLERGY SINUS 2-30-500 MG per tablet Generic drug:  chlorpheniramine-pseudoephedrine-acetaminophen Take 1 tablet by mouth as needed.       Known medication allergies: Allergies  Allergen Reactions  . Morphine Shortness Of Breath, Swelling and Rash  . Aleve [Naproxen Sodium] Rash  . Atorvastatin Other (See Comments)  . Azithromycin Rash  . Celecoxib Other (See Comments)    Makes stomach burn  . Ciprofloxacin Rash  . Citalopram Other (See Comments)  . Codeine Rash  . Colesevelam Other (See Comments)  . Cyclobenzaprine Rash  . Erythromycin Rash  . Fenofibrate Other (See Comments)  . Iodine Other (See Comments)    Betadine blisters  . Moxifloxacin Rash  . Penicillins  Rash  . Sulfonamide Derivatives Rash  . Tramadol Hcl   . Meloxicam Other (See Comments)    Makes stomach burn    I appreciate the opportunity to take part in [name]'s care. Please do not hesitate to contact me with questions.  Sincerely,   R. Edgar Frisk, MD

## 2015-10-16 NOTE — Assessment & Plan Note (Signed)
   Aeroallergen avoidance measures have been discussed and provided in written form.  A prescription has been provided for azelastine nasal spray, 1-2 sprays per nostril 2 times daily as needed. Proper nasal spray technique has been discussed and demonstrated.   I have also recommended nasal saline spray (i.e., Simply Saline) or nasal saline lavage (i.e., NeilMed) as needed prior to medicated nasal sprays.  For now, continue montelukast 10 mg daily at bedtime.

## 2015-10-16 NOTE — Assessment & Plan Note (Signed)
   Treatment plan as outlined above for allergic rhinitis.  A prescription has been provided for Pazeo, one drop per eye daily as needed.  I have also recommended eye lubricant drops (i.e., Natural Tears) as needed. 

## 2015-10-17 ENCOUNTER — Other Ambulatory Visit: Payer: Self-pay | Admitting: *Deleted

## 2015-10-17 MED ORDER — AZELASTINE HCL 0.05 % OP SOLN: 1.0000 [drp] | Freq: Two times a day (BID) | OPHTHALMIC | 12 refills | Status: DC

## 2015-11-12 ENCOUNTER — Telehealth: Payer: Self-pay | Admitting: *Deleted

## 2015-11-12 NOTE — Telephone Encounter (Signed)
Pt would like a return call regarding a statement that she received.

## 2015-11-14 NOTE — Telephone Encounter (Signed)
Pt has 2 accounts set up - told her to disregard bill

## 2015-12-17 ENCOUNTER — Encounter (INDEPENDENT_AMBULATORY_CARE_PROVIDER_SITE_OTHER): Payer: Self-pay

## 2015-12-17 ENCOUNTER — Ambulatory Visit (INDEPENDENT_AMBULATORY_CARE_PROVIDER_SITE_OTHER): Payer: Medicare Other | Admitting: Allergy and Immunology

## 2015-12-17 ENCOUNTER — Encounter: Payer: Self-pay | Admitting: Allergy and Immunology

## 2015-12-17 VITALS — BP 170/98 | HR 75 | Temp 98.1°F | Resp 18 | Ht 61.5 in | Wt 152.9 lb

## 2015-12-17 DIAGNOSIS — I10 Essential (primary) hypertension: Secondary | ICD-10-CM

## 2015-12-17 DIAGNOSIS — H1013 Acute atopic conjunctivitis, bilateral: Secondary | ICD-10-CM | POA: Diagnosis not present

## 2015-12-17 DIAGNOSIS — J449 Chronic obstructive pulmonary disease, unspecified: Secondary | ICD-10-CM | POA: Diagnosis not present

## 2015-12-17 DIAGNOSIS — J3089 Other allergic rhinitis: Secondary | ICD-10-CM

## 2015-12-17 NOTE — Progress Notes (Signed)
Follow-up Note  RE: [name] MRN: [id number] DOB: Aug 25, 1940 Date of Office Visit: 12/17/2015  Primary care provider: Sherrie Mustache, MD Referring provider: Dione Housekeeper, MD  History of present illness: [name] is a 75 y.o. female with allergic rhinoconjunctivitis and COPD presenting today for follow up.  She was last seen in this clinic on 10/16/2015.  She reports that she is unable to afford the [facility] and Spiriva based upon her medication co-pays, therefore she discontinued Spiriva.  She reports that her lower respiratory symptoms have improved significantly while taking Dulera 200/5 g, 2 inhalations via spacer device twice a day.  Her nasal symptoms are currently controlled with montelukast 10 mg daily and azelastine nasal spray and her ocular allergy symptoms are controlled with Pazeo eyedrops.   Assessment and plan: COPD (chronic obstructive pulmonary disease) ([place])  Samples have been provided for Dulera 200/5 g, 2 inhalations via spacer device twice a day.  We will contact her insurance to try to find a suitable alternative with a lower co-pay.  Continue albuterol every 4-6 hours as needed.  Subjective and objective measures of pulmonary function will be followed and the treatment plan will be adjusted accordingly.  Perennial and seasonal allergic rhinitis  Continue appropriate allergen avoidance measures, montelukast 10 mg daily at bedtime, azelastine nasal spray as needed, and nasal saline irrigation as needed.  Allergic conjunctivitis  Continue allergen avoidance and Pazeo eyedrops as needed.  Essential hypertension The patient has been made aware of the elevated blood pressure reading.  She has not taken her blood pressure medications yet this morning.   She will monitor blood pressure and, if it remains elevated despite compliance with medications, has been encouraged to follow up with her primary care physician in the near future regarding  this issue.  [name] has verbalized understanding and agreed to do so.   Diagnostics: Spirometry reveals an FVC of 2.03 L (81% predicted) and an FEV1 of 0.97 L (50% predicted) with an FEV1 ratio of 60%.  There is no postbronchodilator improvement.  Please see scanned spirometry results for details.    Physical examination: Blood pressure (!) 170/98, pulse 75, temperature 98.1 F (36.7 C), temperature source Oral, resp. rate 18, height 5' 1.5" (1.562 m), weight 152 lb 14.4 oz (69.4 kg), SpO2 92 %.  General: Alert, interactive, in no acute distress. HEENT: TMs pearly gray, turbinates mildly edematous without discharge, post-pharynx mildly erythematous. Neck: Supple without lymphadenopathy. Lungs: Mildly decreased breath sounds bilaterally without wheezing, rhonchi or rales. CV: Normal S1, S2 without murmurs. Skin: Warm and dry, without lesions or rashes.  The following portions of the patient's history were reviewed and updated as appropriate: allergies, current medications, past family history, past medical history, past social history, past surgical history and problem list.    Medication List       Accurate as of 12/17/15 12:00 PM. Always use your most recent med list.          ADVAIR DISKUS 250-50 MCG/DOSE Aepb Generic drug:  Fluticasone-Salmeterol INHALE ONE PUFF INTO LUNGS TWICE DAILY   albuterol 108 (90 Base) MCG/ACT inhaler Commonly known as:  PROVENTIL HFA;VENTOLIN HFA Inhale 2 puffs into the lungs every 6 (six) hours as needed.   amLODipine 10 MG tablet Commonly known as:  NORVASC Take 10 mg by mouth daily.   azelastine 0.05 % ophthalmic solution Commonly known as:  OPTIVAR Place 1 drop into both eyes 2 (two) times daily.   Azelastine HCl 0.15 % Soln 1-2  sprays each nostril 2 times per day as needed.   Cetirizine HCl 10 MG Tbdp Take 10 mg by mouth daily.   FLUoxetine 20 MG capsule Commonly known as:  PROZAC Take 20 mg by mouth daily.   furosemide 20 MG  tablet Commonly known as:  LASIX   gabapentin 300 MG capsule Commonly known as:  NEURONTIN Take 300 mg by mouth 2 (two) times daily.   HYDROcodone-acetaminophen 5-325 MG tablet Commonly known as:  NORCO/VICODIN Take 5-325 tablets by mouth every 4 (four) hours.   ipratropium 0.02 % nebulizer solution Commonly known as:  ATROVENT Take 0.5 mg by nebulization 4 (four) times daily as needed for wheezing or shortness of breath.   ipratropium-albuterol 0.5-2.5 (3) MG/3ML Soln Commonly known as:  DUONEB Inhale 0.5-2.5 mLs into the lungs daily.   mometasone-formoterol 200-5 MCG/ACT Aero Commonly known as:  DULERA Inhale 2 puffs into the lungs 2 (two) times daily.   montelukast 10 MG tablet Commonly known as:  SINGULAIR Take 10 mg by mouth at bedtime.   pantoprazole 40 MG tablet Commonly known as:  PROTONIX Take 40 mg by mouth daily.   pravastatin 80 MG tablet Commonly known as:  PRAVACHOL TAKE ONE TABLET BY MOUTH ONCE DAILY IN THE EVENING   predniSONE 10 MG tablet Commonly known as:  DELTASONE Take 10 mg by mouth as directed.   SPIRIVA HANDIHALER 18 MCG inhalation capsule Generic drug:  tiotropium Place 18 mcg into inhaler and inhale daily.   TYLENOL ALLERGY SINUS 2-30-500 MG per tablet Generic drug:  chlorpheniramine-pseudoephedrine-acetaminophen Take 1 tablet by mouth as needed.       Allergies  Allergen Reactions  . Morphine Shortness Of Breath, Swelling and Rash  . Aleve [Naproxen Sodium] Rash  . Atorvastatin Other (See Comments)  . Azithromycin Rash  . Celecoxib Other (See Comments)    Makes stomach burn  . Ciprofloxacin Rash  . Citalopram Other (See Comments)  . Codeine Rash  . Colesevelam Other (See Comments)  . Cyclobenzaprine Rash  . Erythromycin Rash  . Fenofibrate Other (See Comments)  . Iodine Other (See Comments)    Betadine blisters  . Moxifloxacin Rash  . Penicillins Rash  . Sulfonamide Derivatives Rash  . Tramadol Hcl   . Meloxicam Other  (See Comments)    Makes stomach burn   Review of systems: Review of systems negative except as noted in HPI / PMHx or noted below: Constitutional: Negative.  HENT: Negative.   Eyes: Negative.  Respiratory: Negative.   Cardiovascular: Negative.  Gastrointestinal: Negative.  Genitourinary: Negative.  Musculoskeletal: Negative.  Neurological: Negative.  Endo/Heme/Allergies: Negative.  Cutaneous: Negative.  Past Medical History:  Diagnosis Date  . COPD (chronic obstructive pulmonary disease) ([place])   . GERD (gastroesophageal reflux disease)   . Tachycardia     Family History  Problem Relation Age of Onset  . Coronary artery disease      Social History   Social History  . Marital status: Married    Spouse name: N/A  . Number of children: N/A  . Years of education: N/A   Occupational History  . Not on file.   Social History Main Topics  . Smoking status: Former Smoker    Quit date: 10/16/2011  . Smokeless tobacco: Never Used  . Alcohol use No  . Drug use: No  . Sexual activity: Not on file   Other Topics Concern  . Not on file   Social History Narrative  . No narrative on file  I appreciate the opportunity to take part in Ione's care. Please do not hesitate to contact me with questions.  Sincerely,   R. Edgar Frisk, MD

## 2015-12-17 NOTE — Assessment & Plan Note (Signed)
   Continue allergen avoidance and Pazeo eyedrops as needed.

## 2015-12-17 NOTE — Patient Instructions (Addendum)
COPD (chronic obstructive pulmonary disease) ([place])  Samples have been provided for Dulera 200/5 g, 2 inhalations via spacer device twice a day.  We will contact her insurance to try to find a suitable alternative with a lower co-pay.  Continue albuterol every 4-6 hours as needed.  Subjective and objective measures of pulmonary function will be followed and the treatment plan will be adjusted accordingly.  Perennial and seasonal allergic rhinitis  Continue appropriate allergen avoidance measures, montelukast 10 mg daily at bedtime, azelastine nasal spray as needed, and nasal saline irrigation as needed.  Allergic conjunctivitis  Continue allergen avoidance and Pazeo eyedrops as needed.  Essential hypertension The patient has been made aware of the elevated blood pressure reading.  She has not taken her blood pressure medications yet this morning.   She will monitor blood pressure and, if it remains elevated despite compliance with medications, has been encouraged to follow up with her primary care physician in the near future regarding this issue.  [name] has verbalized understanding and agreed to do so.   Return in about 4 months (around 04/18/2016), or if symptoms worsen or fail to improve.

## 2015-12-17 NOTE — Assessment & Plan Note (Signed)
   Samples have been provided for Dulera 200/5 g, 2 inhalations via spacer device twice a day.  We will contact her insurance to try to find a suitable alternative with a lower co-pay.  Continue albuterol every 4-6 hours as needed.  Subjective and objective measures of pulmonary function will be followed and the treatment plan will be adjusted accordingly.

## 2015-12-17 NOTE — Assessment & Plan Note (Signed)
The patient has been made aware of the elevated blood pressure reading.  She has not taken her blood pressure medications yet this morning.   She will monitor blood pressure and, if it remains elevated despite compliance with medications, has been encouraged to follow up with her primary care physician in the near future regarding this issue.  [name] has verbalized understanding and agreed to do so.

## 2015-12-17 NOTE — Assessment & Plan Note (Signed)
   Continue appropriate allergen avoidance measures, montelukast 10 mg daily at bedtime, azelastine nasal spray as needed, and nasal saline irrigation as needed.

## 2016-02-06 ENCOUNTER — Other Ambulatory Visit: Payer: Self-pay | Admitting: Neurosurgery

## 2016-02-06 DIAGNOSIS — M47816 Spondylosis without myelopathy or radiculopathy, lumbar region: Secondary | ICD-10-CM

## 2016-02-13 ENCOUNTER — Other Ambulatory Visit: Payer: Self-pay | Admitting: Neurosurgery

## 2016-02-13 ENCOUNTER — Ambulatory Visit
Admission: RE | Admit: 2016-02-13 | Discharge: 2016-02-13 | Disposition: A | Discharge status: Home / Self Care | Payer: Medicare Other | Source: Ambulatory Visit | Attending: Neurosurgery | Admitting: Neurosurgery

## 2016-02-13 DIAGNOSIS — M47816 Spondylosis without myelopathy or radiculopathy, lumbar region: Secondary | ICD-10-CM

## 2016-02-13 MED ORDER — IOPAMIDOL (ISOVUE-M 200) INJECTION 41%
1.0000 mL | Freq: Once | INTRAMUSCULAR | Status: AC
  Administered 2016-02-13: 1 mL via EPIDURAL

## 2016-02-13 MED ORDER — METHYLPREDNISOLONE ACETATE 40 MG/ML INJ SUSP (RADIOLOG
120.0000 mg | Freq: Once | INTRAMUSCULAR | Status: AC
  Administered 2016-02-13: 120 mg via EPIDURAL

## 2016-02-13 NOTE — Discharge Instructions (Signed)

## 2016-03-07 ENCOUNTER — Other Ambulatory Visit: Payer: Self-pay | Admitting: Nurse Practitioner

## 2016-03-07 DIAGNOSIS — M47816 Spondylosis without myelopathy or radiculopathy, lumbar region: Secondary | ICD-10-CM

## 2016-03-19 ENCOUNTER — Inpatient Hospital Stay: Admission: RE | Admit: 2016-03-19 | Payer: Self-pay | Source: Ambulatory Visit

## 2016-03-25 ENCOUNTER — Other Ambulatory Visit: Payer: Self-pay | Admitting: Nurse Practitioner

## 2016-03-25 ENCOUNTER — Ambulatory Visit
Admission: RE | Admit: 2016-03-25 | Discharge: 2016-03-25 | Disposition: A | Discharge status: Home / Self Care | Payer: Medicare Other | Source: Ambulatory Visit | Attending: Nurse Practitioner | Admitting: Nurse Practitioner

## 2016-03-25 DIAGNOSIS — M47816 Spondylosis without myelopathy or radiculopathy, lumbar region: Secondary | ICD-10-CM

## 2016-03-25 MED ORDER — IOPAMIDOL (ISOVUE-M 200) INJECTION 41%
1.0000 mL | Freq: Once | INTRAMUSCULAR | Status: AC
  Administered 2016-03-25: 1 mL via EPIDURAL

## 2016-03-25 MED ORDER — METHYLPREDNISOLONE ACETATE 40 MG/ML INJ SUSP (RADIOLOG
120.0000 mg | Freq: Once | INTRAMUSCULAR | Status: AC
  Administered 2016-03-25: 120 mg via EPIDURAL

## 2016-03-25 NOTE — Discharge Instructions (Signed)

## 2016-04-21 ENCOUNTER — Ambulatory Visit: Payer: Medicare Other | Admitting: Allergy and Immunology

## 2016-04-22 ENCOUNTER — Encounter: Payer: Self-pay | Admitting: Allergy and Immunology

## 2016-04-22 ENCOUNTER — Ambulatory Visit (INDEPENDENT_AMBULATORY_CARE_PROVIDER_SITE_OTHER): Payer: Medicare Other | Admitting: Allergy and Immunology

## 2016-04-22 VITALS — BP 124/74 | HR 85 | Temp 98.0°F | Resp 18 | Ht 61.5 in | Wt 157.0 lb

## 2016-04-22 DIAGNOSIS — J0100 Acute maxillary sinusitis, unspecified: Secondary | ICD-10-CM

## 2016-04-22 DIAGNOSIS — J449 Chronic obstructive pulmonary disease, unspecified: Secondary | ICD-10-CM

## 2016-04-22 DIAGNOSIS — J3089 Other allergic rhinitis: Secondary | ICD-10-CM

## 2016-04-22 DIAGNOSIS — J019 Acute sinusitis, unspecified: Secondary | ICD-10-CM | POA: Insufficient documentation

## 2016-04-22 MED ORDER — FLUTICASONE-UMECLIDIN-VILANT 100-62.5-25 MCG/INH IN AEPB: 1.0000 | INHALATION_SPRAY | Freq: Every day | RESPIRATORY_TRACT | 3 refills | Status: DC

## 2016-04-22 NOTE — Progress Notes (Signed)
Follow-up Note  RE: [name] MRN: [id number] DOB: 1940-10-23 Date of Office Visit: 04/22/2016  Primary care provider: Sherrie Mustache, MD Referring provider: Dione Housekeeper, MD  History of present illness: [name] is a 76 y.o. female With allergic rhinoconjunctivitis and COPD presenting today for sick visit.  She was last seen in this clinic in October 2017. She reports that over the past week she has experienced sinus pressure/pain, ear pressure, nasal congestion, thick postnasal drainage, and chills.  She went to see her primary care physician yesterday and was started on a ten-day course of doxycycline. Over the past week she has also been experiencing infrequent wheezing.  She states that she discontinued Dulera and Spiriva so she as she can not afford the co-pays for his medications.   Assessment and plan: COPD (chronic obstructive pulmonary disease) ([place])  Samples and a prescription have been provided for Trelegy Ellipta 100-62.5-25 g,  1 inhalation daily.  We will contact her insurance for prior authorization.  Continue albuterol every 4-6 hours as needed.  Follow up with pulmonologist next month as scheduled.  Acute sinusitis  Continue/complete course of doxycycline as prescribed by primary care physician.  Continue Nasacort AQ, one spray per nostril twice daily.  Nasal saline lavage (NeilMed) has been recommended prior to medicated nasal sprays and as needed along with instructions for proper administration.  For thick post nasal drainage, add guaifenesin 600 mg (Mucinex)  twice daily as needed with adequate hydration as discussed.  Perennial and seasonal allergic rhinitis  Continue appropriate allergen avoidance measures, montelukast 10 mg daily at bedtime, Nasacort nasal spray as needed, and nasal saline irrigation as needed.  For thick postnasal drainage, add guaifenesin (as above).   Meds ordered this encounter  Medications  .  Fluticasone-Umeclidin-Vilant (TRELEGY ELLIPTA) 100-62.5-25 MCG/INH AEPB    Sig: Inhale 1 puff into the lungs daily.    Dispense:  1 each    Refill:  3    Please hold until patient calls    Diagnostics: Spirometry reveals an FVC of 1.89 L (76% predicted) and FEV1 of 0.81 L (42% predicted).  Moderate restriction, severe obstruction consistent with previous studies.  There has not been significant reversibility on past studies.   Please see scanned spirometry results for details.    Physical examination: Blood pressure 124/74, pulse 85, temperature 98 F (36.7 C), temperature source Oral, resp. rate 18, height 5' 1.5" (1.562 m), weight 157 lb (71.2 kg), SpO2 93 %.  General: Alert, interactive, in no acute distress. HEENT: TMs pearly gray, turbinates mildly edematous without discharge, post-pharynx mildly erythematous. Neck: Supple without lymphadenopathy. Lungs: Mildly decreased breath sounds bilaterally without wheezing, rhonchi or rales. CV: Normal S1, S2 without murmurs. Skin: Warm and dry, without lesions or rashes.  The following portions of the patient's history were reviewed and updated as appropriate: allergies, current medications, past family history, past medical history, past social history, past surgical history and problem list.  Allergies as of 04/22/2016      Reactions   Morphine Shortness Of Breath, Swelling, Rash   Celecoxib Other (See Comments), Rash   Makes stomach burn   Ciprofloxacin Rash   Other reaction(s): Unknown   Codeine Rash   Fentanyl Rash   Hydrochlorothiazide    Other reaction(s): Dizziness   Iodine Other (See Comments), Rash   Betadine blisters   Levaquin  [levofloxacin In D5w] Rash   Lisinopril    Other reaction(s): Dizziness   Losartan    Other reaction(s): Dizziness  Midazolam Rash   Penicillins Rash   Statins    Other reaction(s): Dizziness   Atorvastatin    Citalopram    Colesevelam    Fenofibrate    Tramadol Hcl    Aleve [naproxen  Sodium] Rash   Azithromycin Rash   Cyclobenzaprine Rash   Erythromycin Rash   Fosamax [alendronate] Rash   Meloxicam Other (See Comments)   Makes stomach burn   Moxifloxacin Rash   Sulfonamide Derivatives Rash      Medication List       Accurate as of 04/22/16 11:30 AM. Always use your most recent med list.          ADVAIR DISKUS 250-50 MCG/DOSE Aepb Generic drug:  Fluticasone-Salmeterol INHALE ONE PUFF INTO LUNGS TWICE DAILY   albuterol 108 (90 Base) MCG/ACT inhaler Commonly known as:  PROVENTIL HFA;VENTOLIN HFA Inhale 2 puffs into the lungs every 6 (six) hours as needed.   alendronate 70 MG tablet Commonly known as:  FOSAMAX Take 70 mg by mouth daily.   amLODipine 10 MG tablet Commonly known as:  NORVASC Take 10 mg by mouth daily.   azelastine 0.05 % ophthalmic solution Commonly known as:  OPTIVAR Place 1 drop into both eyes 2 (two) times daily.   Azelastine HCl 0.15 % Soln 1-2 sprays each nostril 2 times per day as needed.   Cetirizine HCl 10 MG Tbdp Take 10 mg by mouth daily.   doxycycline 100 MG capsule Commonly known as:  VIBRAMYCIN Take 100 mg by mouth 2 (two) times daily.   FLUoxetine 20 MG capsule Commonly known as:  PROZAC Take 20 mg by mouth daily.   Fluticasone-Umeclidin-Vilant 100-62.5-25 MCG/INH Aepb Commonly known as:  TRELEGY ELLIPTA Inhale 1 puff into the lungs daily.   furosemide 20 MG tablet Commonly known as:  LASIX   gabapentin 300 MG capsule Commonly known as:  NEURONTIN Take 300 mg by mouth 2 (two) times daily.   HYDROcodone-acetaminophen 5-325 MG tablet Commonly known as:  NORCO/VICODIN Take 5-325 tablets by mouth every 4 (four) hours.   ipratropium 0.02 % nebulizer solution Commonly known as:  ATROVENT Take 0.5 mg by nebulization 4 (four) times daily as needed for wheezing or shortness of breath.   ipratropium-albuterol 0.5-2.5 (3) MG/3ML Soln Commonly known as:  DUONEB Inhale 0.5-2.5 mLs into the lungs daily.     mometasone-formoterol 200-5 MCG/ACT Aero Commonly known as:  DULERA Inhale 2 puffs into the lungs 2 (two) times daily.   montelukast 10 MG tablet Commonly known as:  SINGULAIR Take 10 mg by mouth at bedtime.   pantoprazole 40 MG tablet Commonly known as:  PROTONIX Take 40 mg by mouth daily.   pravastatin 80 MG tablet Commonly known as:  PRAVACHOL TAKE ONE TABLET BY MOUTH ONCE DAILY IN THE EVENING   SPIRIVA HANDIHALER 18 MCG inhalation capsule Generic drug:  tiotropium Place 18 mcg into inhaler and inhale daily.   TYLENOL ALLERGY SINUS 2-30-500 MG per tablet Generic drug:  chlorpheniramine-pseudoephedrine-acetaminophen Take 1 tablet by mouth as needed.       Allergies  Allergen Reactions  . Morphine Shortness Of Breath, Swelling and Rash  . Celecoxib Other (See Comments) and Rash    Makes stomach burn  . Ciprofloxacin Rash    Other reaction(s): Unknown  . Codeine Rash  . Fentanyl Rash  . Hydrochlorothiazide     Other reaction(s): Dizziness  . Iodine Other (See Comments) and Rash    Betadine blisters  . Levaquin  [Levofloxacin In D5w] Rash  .  Lisinopril     Other reaction(s): Dizziness  . Losartan     Other reaction(s): Dizziness  . Midazolam Rash  . Penicillins Rash  . Statins     Other reaction(s): Dizziness  . Atorvastatin   . Citalopram   . Colesevelam   . Fenofibrate   . Tramadol Hcl   . Aleve [Naproxen Sodium] Rash  . Azithromycin Rash  . Cyclobenzaprine Rash  . Erythromycin Rash  . Fosamax [Alendronate] Rash  . Meloxicam Other (See Comments)    Makes stomach burn  . Moxifloxacin Rash  . Sulfonamide Derivatives Rash   Review of systems: Review of systems negative except as noted in HPI / PMHx or noted below: Constitutional: Negative.  HENT: Negative.   Eyes: Negative.  Respiratory: Negative.   Cardiovascular: Negative.  Gastrointestinal: Negative.  Genitourinary: Negative.  Musculoskeletal: Negative.  Neurological: Negative.   Endo/Heme/Allergies: Negative.  Cutaneous: Negative.  Past Medical History:  Diagnosis Date  . COPD (chronic obstructive pulmonary disease) ([place])   . GERD (gastroesophageal reflux disease)   . Tachycardia     Family History  Problem Relation Age of Onset  . Coronary artery disease      Social History   Social History  . Marital status: Married    Spouse name: N/A  . Number of children: N/A  . Years of education: N/A   Occupational History  . Not on file.   Social History Main Topics  . Smoking status: Former Smoker    Quit date: 10/16/2011  . Smokeless tobacco: Never Used  . Alcohol use No  . Drug use: No  . Sexual activity: Not on file   Other Topics Concern  . Not on file   Social History Narrative  . No narrative on file    I appreciate the opportunity to take part in Sherol's care. Please do not hesitate to contact me with questions.  Sincerely,   R. Edgar Frisk, MD

## 2016-04-22 NOTE — Assessment & Plan Note (Signed)
   Continue appropriate allergen avoidance measures, montelukast 10 mg daily at bedtime, Nasacort nasal spray as needed, and nasal saline irrigation as needed.  For thick postnasal drainage, add guaifenesin (as above).

## 2016-04-22 NOTE — Assessment & Plan Note (Signed)
   Continue/complete course of doxycycline as prescribed by primary care physician.  Continue Nasacort AQ, one spray per nostril twice daily.  Nasal saline lavage (NeilMed) has been recommended prior to medicated nasal sprays and as needed along with instructions for proper administration.  For thick post nasal drainage, add guaifenesin 600 mg (Mucinex)  twice daily as needed with adequate hydration as discussed.

## 2016-04-22 NOTE — Assessment & Plan Note (Addendum)
   Samples and a prescription have been provided for Trelegy Ellipta 100-62.5-25 g, 1 inhalation daily.  We will contact her insurance for prior authorization.  Continue albuterol every 4-6 hours as needed.  Follow up with pulmonologist next month as scheduled.

## 2016-04-22 NOTE — Patient Instructions (Addendum)
COPD (chronic obstructive pulmonary disease) ([place])  Samples and a prescription have been provided for Trelegy Ellipta 100-62.5-25 g,  1 inhalation daily.  We will contact her insurance for prior authorization.  Continue albuterol every 4-6 hours as needed.  Follow up with pulmonologist next month as scheduled.  Acute sinusitis  Continue/complete course of doxycycline as prescribed by primary care physician.  Continue Nasacort AQ, one spray per nostril twice daily.  Nasal saline lavage (NeilMed) has been recommended prior to medicated nasal sprays and as needed along with instructions for proper administration.  For thick post nasal drainage, add guaifenesin 600 mg (Mucinex)  twice daily as needed with adequate hydration as discussed.  Perennial and seasonal allergic rhinitis  Continue appropriate allergen avoidance measures, montelukast 10 mg daily at bedtime, Nasacort nasal spray as needed, and nasal saline irrigation as needed.  For thick postnasal drainage, add guaifenesin (as above).   Return in about 6 months (around 10/20/2016), or if symptoms worsen or fail to improve.

## 2016-12-24 ENCOUNTER — Other Ambulatory Visit: Payer: Self-pay | Admitting: Nurse Practitioner

## 2016-12-24 DIAGNOSIS — M4306 Spondylolysis, lumbar region: Secondary | ICD-10-CM

## 2017-01-01 ENCOUNTER — Ambulatory Visit
Admission: RE | Admit: 2017-01-01 | Discharge: 2017-01-01 | Disposition: A | Discharge status: Home / Self Care | Payer: Medicare Other | Source: Ambulatory Visit | Attending: Nurse Practitioner | Admitting: Nurse Practitioner

## 2017-01-01 DIAGNOSIS — M4306 Spondylolysis, lumbar region: Secondary | ICD-10-CM

## 2017-01-01 MED ORDER — METHYLPREDNISOLONE ACETATE 40 MG/ML INJ SUSP (RADIOLOG
120.0000 mg | Freq: Once | INTRAMUSCULAR | Status: AC
  Administered 2017-01-01: 120 mg via EPIDURAL

## 2017-01-01 MED ORDER — IOPAMIDOL (ISOVUE-M 200) INJECTION 41%
1.0000 mL | Freq: Once | INTRAMUSCULAR | Status: AC
  Administered 2017-01-01: 1 mL via EPIDURAL

## 2017-01-08 ENCOUNTER — Other Ambulatory Visit: Payer: Medicare Other

## 2017-06-12 ENCOUNTER — Telehealth: Payer: Self-pay

## 2017-06-12 NOTE — Telephone Encounter (Signed)
FAXED NOTES TO [name]

## 2017-06-15 ENCOUNTER — Telehealth: Payer: Self-pay

## 2017-06-15 NOTE — Telephone Encounter (Signed)
FAXED EKG TO [facility]

## 2017-07-01 ENCOUNTER — Encounter: Payer: Self-pay | Admitting: *Deleted

## 2017-07-02 ENCOUNTER — Ambulatory Visit (INDEPENDENT_AMBULATORY_CARE_PROVIDER_SITE_OTHER): Payer: Medicare Other

## 2017-07-02 ENCOUNTER — Encounter: Payer: Self-pay | Admitting: Cardiology

## 2017-07-02 ENCOUNTER — Other Ambulatory Visit: Payer: Self-pay

## 2017-07-02 ENCOUNTER — Ambulatory Visit: Payer: Medicare Other | Admitting: Cardiology

## 2017-07-02 VITALS — BP 122/80 | HR 73 | Ht 64.0 in | Wt 148.0 lb

## 2017-07-02 DIAGNOSIS — I5022 Chronic systolic (congestive) heart failure: Secondary | ICD-10-CM

## 2017-07-02 LAB — ECHOCARDIOGRAM COMPLETE
HEIGHTINCHES: 64 in
Weight: 2368 oz

## 2017-07-02 MED ORDER — LOSARTAN POTASSIUM 25 MG PO TABS: 25.0000 mg | ORAL_TABLET | Freq: Every day | ORAL | 1 refills | Status: DC

## 2017-07-02 MED ORDER — TORSEMIDE 20 MG PO TABS: 40.0000 mg | ORAL_TABLET | Freq: Every day | ORAL | 1 refills | Status: DC

## 2017-07-02 MED ORDER — CARVEDILOL 6.25 MG PO TABS: 6.2500 mg | ORAL_TABLET | Freq: Two times a day (BID) | ORAL | 1 refills | Status: DC

## 2017-07-02 NOTE — Progress Notes (Signed)
Clinical Summary [name] is a 77 y.o.female seen today as new consult, referred by Dr Edrick Oh for chronic systolic HF.    1. Chronic systolic HF - admission 07/91 to [facility] with SOB. Records were peronally reviewed.  - diagnosed with pneumonia and also clinical CHF - echo 05/2017 at [facility] with LVEF 30-35%, grade II diastolic dysfunction, new diagnosis for patient. Apical hypokinesis - 05/2017 cath [facility]: nonobstructive CAD, from notes though possibly stress induced CM.  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 LV gram reported LVEF 45-50% - lost her sister 1 year ago.   - some SOB early in the AM, overall improved but not back baseline - can have some LE edema. Limiting sodium intake. Home weights around 145 lbs and stable. - 06/25/17 Cr 1.21, K 4.2 - compliant with meds    2. COPD - on home O2 2L - followed by Dr Luan Pulling  3. Anemia - followed by pcp, referred to hemaotology.   Past Medical History:  Diagnosis Date  . COPD (chronic obstructive pulmonary disease) ([place])   . GERD (gastroesophageal reflux disease)   . Tachycardia      Allergies  Allergen Reactions  . Morphine Shortness Of Breath, Swelling and Rash  . Celecoxib Other (See Comments) and Rash    Makes stomach burn  . Ciprofloxacin Rash    Other reaction(s): Unknown  . Codeine Rash  . Fentanyl Rash  . Hydrochlorothiazide     Other reaction(s): Dizziness  . Iodine Other (See Comments) and Rash    Betadine blisters  . Levaquin  [Levofloxacin In D5w] Rash  . Lisinopril     Other reaction(s): Dizziness  . Losartan     Other reaction(s): Dizziness  . Midazolam Rash  . Penicillins Rash  . Statins     Other reaction(s): Dizziness  . Atorvastatin   . Citalopram   . Colesevelam   . Fenofibrate   . Tramadol Hcl   . Aleve [Naproxen Sodium] Rash  . Azithromycin Rash  . Cyclobenzaprine Rash  . Erythromycin Rash  . Fosamax [Alendronate] Rash  . Meloxicam Other (See Comments)    Makes stomach  burn  . Moxifloxacin Rash  . Sulfonamide Derivatives Rash     Current Outpatient Medications  Medication Sig Dispense Refill  . alendronate (FOSAMAX) 70 MG tablet Take 70 mg by mouth daily.  0  . amLODipine (NORVASC) 10 MG tablet Take 10 mg by mouth daily.    Marland Kitchen azelastine (OPTIVAR) 0.05 % ophthalmic solution Place 1 drop into both eyes 2 (two) times daily. 6 mL 12  . Azelastine HCl 0.15 % SOLN 1-2 sprays each nostril 2 times per day as needed. 30 mL 5  . Cetirizine HCl 10 MG TBDP Take 10 mg by mouth daily.    . chlorpheniramine-pseudoephedrine-acetaminophen (TYLENOL ALLERGY SINUS) 2-30-500 MG per tablet Take 1 tablet by mouth as needed.    . doxycycline (VIBRAMYCIN) 100 MG capsule Take 100 mg by mouth 2 (two) times daily.  0  . FLUoxetine (PROZAC) 20 MG capsule Take 20 mg by mouth daily.    . Fluticasone-Salmeterol (ADVAIR DISKUS) 250-50 MCG/DOSE AEPB INHALE ONE PUFF INTO LUNGS TWICE DAILY    . Fluticasone-Umeclidin-Vilant (TRELEGY ELLIPTA) 100-62.5-25 MCG/INH AEPB Inhale 1 puff into the lungs daily. 1 each 3  . furosemide (LASIX) 20 MG tablet   0  . gabapentin (NEURONTIN) 300 MG capsule Take 300 mg by mouth 2 (two) times daily.   2  . ipratropium (ATROVENT) 0.02 %  nebulizer solution Take 0.5 mg by nebulization 4 (four) times daily as needed for wheezing or shortness of breath.    Marland Kitchen ipratropium-albuterol (DUONEB) 0.5-2.5 (3) MG/3ML SOLN Inhale 0.5-2.5 mLs into the lungs daily.    . mometasone-formoterol (DULERA) 200-5 MCG/ACT AERO Inhale 2 puffs into the lungs 2 (two) times daily. (Patient not taking: Reported on 04/22/2016) 1 Inhaler 5  . pantoprazole (PROTONIX) 40 MG tablet Take 40 mg by mouth daily.    . pravastatin (PRAVACHOL) 80 MG tablet TAKE ONE TABLET BY MOUTH ONCE DAILY IN THE EVENING    . tiotropium (SPIRIVA HANDIHALER) 18 MCG inhalation capsule Place 18 mcg into inhaler and inhale daily.      No current facility-administered medications for this visit.      Past Surgical  History:  Procedure Laterality Date  . APPENDECTOMY    . arthroscopc     total knee  . PARTIAL HYSTERECTOMY    . ruptured disk       Allergies  Allergen Reactions  . Morphine Shortness Of Breath, Swelling and Rash  . Celecoxib Other (See Comments) and Rash    Makes stomach burn  . Ciprofloxacin Rash    Other reaction(s): Unknown  . Codeine Rash  . Fentanyl Rash  . Hydrochlorothiazide     Other reaction(s): Dizziness  . Iodine Other (See Comments) and Rash    Betadine blisters  . Levaquin  [Levofloxacin In D5w] Rash  . Lisinopril     Other reaction(s): Dizziness  . Losartan     Other reaction(s): Dizziness  . Midazolam Rash  . Penicillins Rash  . Statins     Other reaction(s): Dizziness  . Atorvastatin   . Citalopram   . Colesevelam   . Fenofibrate   . Tramadol Hcl   . Aleve [Naproxen Sodium] Rash  . Azithromycin Rash  . Cyclobenzaprine Rash  . Erythromycin Rash  . Fosamax [Alendronate] Rash  . Meloxicam Other (See Comments)    Makes stomach burn  . Moxifloxacin Rash  . Sulfonamide Derivatives Rash      Family History  Problem Relation Age of Onset  . Coronary artery disease Unknown      Social History Ms. Stantz reports that she quit smoking about 5 years ago. She has never used smokeless tobacco. Ms. [place] reports that she does not drink alcohol.   Review of Systems CONSTITUTIONAL: No weight loss, fever, chills, weakness or fatigue.  HEENT: Eyes: No visual loss, blurred vision, double vision or yellow sclerae.No hearing loss, sneezing, congestion, runny nose or sore throat.  SKIN: No rash or itching.  CARDIOVASCULAR: per hpi RESPIRATORY: per hpi GASTROINTESTINAL: No anorexia, nausea, vomiting or diarrhea. No abdominal pain or blood.  GENITOURINARY: No burning on urination, no polyuria NEUROLOGICAL: No headache, dizziness, syncope, paralysis, ataxia, numbness or tingling in the extremities. No change in bowel or bladder control.  MUSCULOSKELETAL:  No muscle, back pain, joint pain or stiffness.  LYMPHATICS: No enlarged nodes. No history of splenectomy.  PSYCHIATRIC: No history of depression or anxiety.  ENDOCRINOLOGIC: No reports of sweating, cold or heat intolerance. No polyuria or polydipsia.  Marland Kitchen   Physical Examination Vitals:   07/02/17 0859  BP: 122/80  Pulse: 73  SpO2: 97%   Vitals:   07/02/17 0859  Weight: 148 lb (67.1 kg)  Height: 5\' 4"  (1.626 m)    Gen: resting comfortably, no acute distress HEENT: no scleral icterus, pupils equal round and reactive, no palptable cervical adenopathy,  CV: RRR, no m/r/g, no jvd  Resp: Clear to auscultation bilaterally GI: abdomen is soft, non-tender, non-distended, normal bowel sounds, no hepatosplenomegaly MSK: extremities are warm, no edema.  Skin: warm, no rash Neuro:  no focal deficits Psych: appropriate affect   Diagnostic Studies  05/2017 cath  PROCEDURES PERFORMED:  1. Left heart catheterization 2. Left ventriculography 3. Coronary angiography 4. Right heart catheterization  APPROACH: Right femoral artery, right femoral vein.  EQUIPMENT: 70F femoral arterial sheath 42F femoral venous sheath 105F Swan-Ganz catheter 70F JL 4 70F JR4 70F pigtail Perclose x 2   MEDICATIONS: IV Benadryl 25 mg SQ lidocaine  FINDINGS:  Coronary Angiography 1. Left Main - Normal 2. Left anterior descending artery - gives off 3 diagonal branches and  continues down to the cardiac apex. There are 30% wall irregularities in  the mid vessel. 3. Diagonals - 40% proximal narrowing in D1.D2 and D3 are smaller, no  significant disease. 4. Left Circumflex - terminates in a large branching OM. 20% mid vessel  luminal irregularities. 5. Obtuse Marginals - 20% narrowing in the ostium of one Reneka Nebergall. 6. Right Coronary Artery - large vessel that gives off the PDA and a large  PLV Kayron Kalmar. 30% mid and distal luminal irregularities. 7. Posterior Descending Artery - Normal  Additional  comments on angiography: Right Dominance   Hemodynamics 1.Aortic Pressure - 140/74 mmHg 2.Left Ventricular - 140/18-25 mmHg 3.PCWP - 17/19 (18) 4.PA - 45/20 (32) 5.RV -45/14 6.RA - (10-14) 7.Femoral artery O2 saturation: 95% 8.Pulmonary artery O2 saturation: 66% 9.Fick CO:5.1 L/min, CI: 2.9 L/min/m2  Left Ventriculography - normal left ventricular chamber size with  anterolateral and diaphragmatic hypokinesia and EF visually estimated to  be 45-50%. This could possibly be consistent with recovering Takotsubo  cardiomyopathy.  CONCLUSIONS:  1.Nonobstructive CAD. 2.Cardiomyopathy - LV wall motion abnormality could be consistent with  recovering Takotsubo cardiomyopathy. 3.Mild to moderately elevated right heart pressures. 4.RFA, RFA access-closed with Perclose vascular suture device. 5.No complications.  05/2017 echo   Interpretation Summary A complete portable two-dimensional transthoracic echocardiogram with color flow Doppler and Spectral Doppler was performed. The study was technically difficult. Grade II moderate diastolic dysfunction; pseudonormal mitral inflow pattern. The left ventricle is normal in size. There is normal left ventricular wall thickness. The left ventricular ejection fraction is markedly reduced (30-35%). Right ventricular systolic pressure is elevated between 40-21mm Hg, consistent with moderate pulmonary hypertension. There is mild (1+) tricuspid regurgitation. The aortic valve is not well visualized, but is grossly normal. Dilated inferior vena cava suggests increased right atrial pressure.  Left Ventricle The left ventricle is normal in size. There is normal left ventricular wall thickness. The left ventricular ejection fraction is markedly reduced (30- 35%). There is anteroseptal and apical wall akinesis. Grade II moderate diastolic dysfunction; pseudonormal mitral inflow pattern.   Right Ventricle The  right ventricle is grossly normal in size and function.  Atria The left and right atria are normal size. Dilated inferior vena cava suggests increased right atrial pressure.  Mitral Valve The mitral valve is grossly normal. There is trace mitral regurgitation.   Tricuspid Valve The tricuspid valve is not well visualized.Right ventricular systolic pressure is elevated between 40-52mm Hg, consistent with moderate pulmonary hypertension. There is mild (1+) tricuspid regurgitation.  Aortic Valve The aortic valve is not well visualized, but is grossly normal. There is no aortic regurgitation present.  Pulmonic Valve The pulmonic valve is not well visualized, unable to adequately assess function.  Vessels The aortic root is normal in diameter.  Pericardium There is  no pericardial effusion.  MMode/2D Measurements & Calculations IVSd: 1.1 cmLVIDd: 4.2 cm LVIDs: 3.5 cm LVPWd: 1.1 cm  _____________________________________________________________ LV mass(C)d: 160.7 gramsAo root diam: 3.4 cm  LV mass(C)dI: 91.3 grams/m2 Ao root area: 9.3 cm2 LA dimension: 3.5 cm _____________________________________________________________  EDV(sp4-el): 106.8 ml SV(MOD-sp4): 34.1 ml LVAs ap4: 24.9 cm2 LVLs ap4: 7.5 cm ESV(MOD-sp4): 67.3 ml ESV(sp4-el): 70.4 ml _____________________________________________________________  SV(sp4-el): 36.3 ml LAV (MOD-bp) Index: 19.8 ml/m2  _____________________________________________________________ LAV(MOD-bp): 34.8 ml  Doppler Measurements & Calculations MV E max vel: 142.0 cm/sec  MV dec slope: 830.8 cm/sec2  MV dec time: 0.17  sec _____________________________________________________________  TR max vel: 290.5 cm/sec RAP systole: 15.0 mmHg TR max PG: 33.8 mmHg RVSP(TR): 48.8 mmHg  ____________________________________________________________________________   Electronically signed PY:PPJK Powers, M.D.,F.A.C.C. on 05/11/2017 02:04  PM Ordering Physician: [id number]  Other Result Information  Acute Interface, Incoming Card Results - 05/11/2017  2:05 PM EDT                                                [facility]                                             [place]  +-----------------+ +------------------------------------+     Shelba Flake:                 : :                                    Rondall Allegra,:                 : :                                    :       [place] [id number]   :                 : :                                    :    [phone]:                 : :                                    :    Fax [phone]                 : +------------------------------------+         [phone]     :                 : +-----------------+  Echocardiogram                                               Report +--------------------------------------------------------------------------+ :Name: [name]                Study Date: 05/11/2017 11:33 AM  : :Patient Location: [place] ICC1^ICC1 120-01   BP: 119/70 mmHg                  : :MRN: [id number]                                                             : :DOB: [birth year]                          Height: 64 in                    : :Gender: Female                           Weight: 156 lb                   : :Race: WHITE,White or Caucasian                                            : :Age: 68 yrs                              BSA: 1.8 m2                      : :Reason For Study:  ^Dyspnea                                                : :History: HTN, COPD, GERD, Asthma, HLD, Rheumatic Fever                    : :Referring Physician: Dione Housekeeper                                      : :Performed By: Rodena Medin, R.D.C.S                                     : +--------------------------------------------------------------------------+  Interpretation Summary A complete portable two-dimensional transthoracic echocardiogram with color flow Doppler and Spectral Doppler was performed. The study was technically difficult. Grade II moderate diastolic dysfunction; pseudonormal mitral inflow pattern. The left ventricle is normal in size. There is normal left ventricular wall thickness. The left ventricular ejection fraction is markedly reduced (30-35%). Right ventricular systolic pressure is elevated between 40-64mm  Hg, consistent with moderate pulmonary hypertension. There is mild (1+) tricuspid regurgitation. The aortic valve is not well visualized, but is grossly normal. Dilated inferior vena cava suggests increased right atrial pressure.  Left Ventricle The left ventricle is normal in size. There is normal left ventricular wall thickness. The left ventricular ejection fraction is markedly reduced (30- 35%). There is anteroseptal and apical wall akinesis. Grade II moderate diastolic dysfunction; pseudonormal mitral inflow pattern.   Right Ventricle The right ventricle is grossly normal in size and function.  Atria The left and right atria are normal size. Dilated inferior vena cava suggests increased right atrial pressure.  Mitral Valve The mitral valve is grossly normal. There is trace mitral regurgitation.   Tricuspid Valve The tricuspid valve is not well visualized.Right ventricular systolic pressure is elevated between 40-44mm Hg, consistent with moderate pulmonary hypertension. There is mild (1+) tricuspid regurgitation.  Aortic Valve The  aortic valve is not well visualized, but is grossly normal. There is no aortic regurgitation present.  Pulmonic Valve The pulmonic valve is not well visualized, unable to adequately assess function.  Vessels The aortic root is normal in diameter.  Pericardium There is no pericardial effusion.  MMode/2D Measurements & Calculations IVSd: 1.1 cm                        LVIDd: 4.2 cm                                     LVIDs: 3.5 cm                                     LVPWd: 1.1 cm          _____________________________________________________________ LV mass(C)d: 160.7 grams            Ao root diam: 3.4 cm  LV mass(C)dI: 91.3 grams/m2         Ao root area: 9.3 cm2                                     LA dimension: 3.5 cm         _____________________________________________________________  EDV(sp4-el): 106.8 ml               SV(MOD-sp4): 34.1 ml LVAs ap4: 24.9 cm2 LVLs ap4: 7.5 cm ESV(MOD-sp4): 67.3 ml ESV(sp4-el): 70.4 ml         _____________________________________________________________  SV(sp4-el): 36.3 ml                 LAV (MOD-bp) Index: 19.8 ml/m2          _____________________________________________________________ LAV(MOD-bp): 34.8 ml  Doppler Measurements & Calculations MV E max vel: 142.0 cm/sec                                      MV dec slope: 830.8 cm/sec2                                      MV dec time: 0.17 sec         _____________________________________________________________  TR max vel: 290.5 cm/sec             RAP systole: 15.0 mmHg TR max PG: 33.8 mmHg RVSP(TR): 48.8 mmHg      Assessment and Plan  1. Chronic systolic HF - suspected stress induced CM. We will repeat echo, if LVEF has normalized will not need to continue aggressively titrating meds - no significant symptoms     F/u 63months   Arnoldo Lenis, M.D., F.A.C.C.

## 2017-07-02 NOTE — Patient Instructions (Signed)
Your physician recommends that you schedule a follow-up appointment in: 2 [place]  Your physician recommends that you continue on your current medications as directed. Please refer to the Current Medication list given to you today.  Your physician has requested that you have an echocardiogram. Echocardiography is a painless test that uses sound waves to create images of your heart. It provides your doctor with information about the size and shape of your heart and how well your heart's chambers and valves are working. This procedure takes approximately one hour. There are no restrictions for this procedure.  Thank you for choosing [facility]!!

## 2017-07-09 ENCOUNTER — Encounter: Payer: Self-pay | Admitting: Cardiology

## 2017-07-13 ENCOUNTER — Telehealth: Payer: Self-pay | Admitting: *Deleted

## 2017-07-13 NOTE — Telephone Encounter (Signed)
Pt aware and voiced understanding - 6 month recall placed - routed to pcp

## 2017-07-13 NOTE — Telephone Encounter (Signed)
-----   Message from Arnoldo Lenis, MD sent at 07/06/2017 11:40 AM EDT ----- Echo shows heart funtion has improved and is back to normal, we will not need to make any additional med changes at this time but would continue what she is currently on. F/u 6 months   Zandra Abts MD

## 2017-09-01 ENCOUNTER — Ambulatory Visit: Payer: Self-pay | Admitting: Cardiology

## 2017-12-28 ENCOUNTER — Other Ambulatory Visit: Payer: Self-pay | Admitting: Cardiology

## 2017-12-28 MED ORDER — CARVEDILOL 6.25 MG PO TABS: 6.2500 mg | ORAL_TABLET | Freq: Two times a day (BID) | ORAL | 1 refills | Status: DC

## 2017-12-28 NOTE — Telephone Encounter (Signed)
Coreg refilled.

## 2018-01-11 ENCOUNTER — Encounter: Payer: Self-pay | Admitting: *Deleted

## 2018-01-12 ENCOUNTER — Ambulatory Visit: Payer: Medicare Other | Admitting: Cardiology

## 2018-01-12 ENCOUNTER — Encounter: Payer: Self-pay | Admitting: *Deleted

## 2018-01-12 VITALS — BP 115/72 | HR 83 | Ht 64.0 in | Wt 147.0 lb

## 2018-01-12 DIAGNOSIS — I5181 Takotsubo syndrome: Secondary | ICD-10-CM

## 2018-01-12 MED ORDER — CARVEDILOL 6.25 MG PO TABS: 6.2500 mg | ORAL_TABLET | Freq: Two times a day (BID) | ORAL | 3 refills | Status: AC

## 2018-01-12 MED ORDER — LOSARTAN POTASSIUM 25 MG PO TABS: 25.0000 mg | ORAL_TABLET | Freq: Every day | ORAL | 3 refills | Status: AC

## 2018-01-12 MED ORDER — TORSEMIDE 20 MG PO TABS: ORAL_TABLET | ORAL | 1 refills | Status: AC

## 2018-01-12 NOTE — Patient Instructions (Signed)
Your physician wants you to follow-up in: [place] will receive a reminder letter in the mail two months in advance. If you don't receive a letter, please call our office to schedule the follow-up appointment.  Your physician has recommended you make the following change in your medication:   TAKE TORSEMIDE 20 MG DAILY - YOU MAKE TAKE ADDITIONAL 20 MG AS NEEDED FOR SWELLING  Thank you for choosing [facility]!!

## 2018-01-12 NOTE — Progress Notes (Signed)
Clinical Summary Ms. Panzer is a 77 y.o.female seen today as new consult, referred by Dr Edrick Oh for chronic systolic HF.    1. Chronic systolic HF - admission 05/74 to [facility] with SOB. Records were peronally reviewed.  - diagnosed with pneumonia and also clinical CHF - echo 05/2017 at [facility] with LVEF 30-35%, grade II diastolic dysfunction, new diagnosis for patient. Apical hypokinesis - 05/2017 cath [facility]: nonobstructive CAD, from notes though possibly stress induced CM.  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 LV gram reported LVEF 45-50%   - 07/2017 echo LVEF 65-70%, no WMAs, grade I diastolic dysfunction - No recent edema. Chronic stable SOB related to her SOB - has labs next week with pcp, last set a few weeks ago  2. COPD - on home O2 2L - followed by Dr Luan Pulling     Past Medical History:  Diagnosis Date  . COPD (chronic obstructive pulmonary disease) ([place])   . GERD (gastroesophageal reflux disease)   . Tachycardia      Allergies  Allergen Reactions  . Morphine Shortness Of Breath, Swelling and Rash  . Celecoxib Other (See Comments) and Rash    Makes stomach burn  . Ciprofloxacin Rash    Other reaction(s): Unknown  . Codeine Rash  . Fentanyl Rash  . Hydrochlorothiazide     Other reaction(s): Dizziness  . Iodine Other (See Comments) and Rash    Betadine blisters  . Levaquin  [Levofloxacin In D5w] Rash  . Lisinopril     Other reaction(s): Dizziness  . Losartan     Other reaction(s): Dizziness  . Midazolam Rash  . Penicillins Rash  . Statins     Other reaction(s): Dizziness  . Atorvastatin   . Citalopram   . Colesevelam   . Fenofibrate   . Tramadol Hcl   . Aleve [Naproxen Sodium] Rash  . Azithromycin Rash  . Cyclobenzaprine Rash  . Erythromycin Rash  . Fosamax [Alendronate] Rash  . Meloxicam Other (See Comments)    Makes stomach burn  . Moxifloxacin Rash  . Sulfonamide Derivatives Rash     Current Outpatient Medications    Medication Sig Dispense Refill  . albuterol (PROVENTIL HFA;VENTOLIN HFA) 108 (90 Base) MCG/ACT inhaler Inhale into the lungs every 6 (six) hours as needed for wheezing or shortness of breath.    Marland Kitchen azelastine (OPTIVAR) 0.05 % ophthalmic solution Place 1 drop into both eyes 2 (two) times daily. 6 mL 12  . Azelastine HCl 0.15 % SOLN 1-2 sprays each nostril 2 times per day as needed. 30 mL 5  . budesonide-formoterol (SYMBICORT) 160-4.5 MCG/ACT inhaler Inhale 2 puffs into the lungs 2 (two) times daily.    . carvedilol (COREG) 6.25 MG tablet Take 1 tablet (6.25 mg total) by mouth 2 (two) times daily with a meal. 180 tablet 1  . cefdinir (OMNICEF) 300 MG capsule Take 300 mg by mouth 2 (two) times daily.    Marland Kitchen gabapentin (NEURONTIN) 300 MG capsule Take 300 mg by mouth 2 (two) times daily.   2  . ipratropium (ATROVENT) 0.02 % nebulizer solution Take 0.5 mg by nebulization 4 (four) times daily as needed for wheezing or shortness of breath.    . losartan (COZAAR) 25 MG tablet Take 1 tablet (25 mg total) by mouth daily. 90 tablet 1  . pantoprazole (PROTONIX) 40 MG tablet Take 40 mg by mouth daily.    . pravastatin (PRAVACHOL) 80 MG tablet TAKE ONE TABLET BY MOUTH ONCE DAILY IN  THE EVENING    . torsemide (DEMADEX) 20 MG tablet Take 2 tablets (40 mg total) by mouth daily. 180 tablet 1   No current facility-administered medications for this visit.      Past Surgical History:  Procedure Laterality Date  . APPENDECTOMY    . arthroscopc     total knee  . PARTIAL HYSTERECTOMY    . ruptured disk       Allergies  Allergen Reactions  . Morphine Shortness Of Breath, Swelling and Rash  . Celecoxib Other (See Comments) and Rash    Makes stomach burn  . Ciprofloxacin Rash    Other reaction(s): Unknown  . Codeine Rash  . Fentanyl Rash  . Hydrochlorothiazide     Other reaction(s): Dizziness  . Iodine Other (See Comments) and Rash    Betadine blisters  . Levaquin  [Levofloxacin In D5w] Rash  .  Lisinopril     Other reaction(s): Dizziness  . Losartan     Other reaction(s): Dizziness  . Midazolam Rash  . Penicillins Rash  . Statins     Other reaction(s): Dizziness  . Atorvastatin   . Citalopram   . Colesevelam   . Fenofibrate   . Tramadol Hcl   . Aleve [Naproxen Sodium] Rash  . Azithromycin Rash  . Cyclobenzaprine Rash  . Erythromycin Rash  . Fosamax [Alendronate] Rash  . Meloxicam Other (See Comments)    Makes stomach burn  . Moxifloxacin Rash  . Sulfonamide Derivatives Rash      Family History  Problem Relation Age of Onset  . Coronary artery disease Unknown      Social History Ms. Murtaugh reports that she quit smoking about 6 years ago. She has never used smokeless tobacco. Ms. Sweeten reports that she does not drink alcohol.   Review of Systems CONSTITUTIONAL: No weight loss, fever, chills, weakness or fatigue.  HEENT: Eyes: No visual loss, blurred vision, double vision or yellow sclerae.No hearing loss, sneezing, congestion, runny nose or sore throat.  SKIN: No rash or itching.  CARDIOVASCULAR: per hpi RESPIRATORY: +SOB  GASTROINTESTINAL: No anorexia, nausea, vomiting or diarrhea. No abdominal pain or blood.  GENITOURINARY: No burning on urination, no polyuria NEUROLOGICAL: No headache, dizziness, syncope, paralysis, ataxia, numbness or tingling in the extremities. No change in bowel or bladder control.  MUSCULOSKELETAL: No muscle, back pain, joint pain or stiffness.  LYMPHATICS: No enlarged nodes. No history of splenectomy.  PSYCHIATRIC: No history of depression or anxiety.  ENDOCRINOLOGIC: No reports of sweating, cold or heat intolerance. No polyuria or polydipsia.  Marland Kitchen   Physical Examination Vitals:   01/12/18 1348  BP: 115/72  Pulse: 83  SpO2: 91%   Vitals:   01/12/18 1348  Weight: 147 lb (66.7 kg)  Height: 5\' 4"  (1.626 m)    Gen: resting comfortably, no acute distress HEENT: no scleral icterus, pupils equal round and reactive, no  palptable cervical adenopathy,  CV: RRR, no m/r/g no jvd Resp: Clear to auscultation bilaterally GI: abdomen is soft, non-tender, non-distended, normal bowel sounds, no hepatosplenomegaly MSK: extremities are warm, no edema.  Skin: warm, no rash Neuro:  no focal deficits Psych: appropriate affect   Diagnostic Studies 05/2017 cath  PROCEDURES PERFORMED:  1. Left heart catheterization 2. Left ventriculography 3. Coronary angiography 4. Right heart catheterization  APPROACH: Right femoral artery, right femoral vein.  EQUIPMENT: 25F femoral arterial sheath 14F femoral venous sheath 9F Swan-Ganz catheter 25F JL 4 25F JR4 25F pigtail Perclose x 2   MEDICATIONS: IV Benadryl  25 mg SQ lidocaine  FINDINGS:  Coronary Angiography 1. Left Main - Normal 2. Left anterior descending artery - gives off 3 diagonal branches and  continues down to the cardiac apex. There are 30% wall irregularities in  the mid vessel. 3. Diagonals - 40% proximal narrowing in D1.D2 and D3 are smaller, no  significant disease. 4. Left Circumflex - terminates in a large branching OM. 20% mid vessel  luminal irregularities. 5. Obtuse Marginals - 20% narrowing in the ostium of one Cordney Barstow. 6. Right Coronary Artery - large vessel that gives off the PDA and a large  PLV Yesica Kemler. 30% mid and distal luminal irregularities. 7. Posterior Descending Artery - Normal  Additional comments on angiography: Right Dominance   Hemodynamics 1.Aortic Pressure - 140/74 mmHg 2.Left Ventricular - 140/18-25 mmHg 3.PCWP - 17/19 (18) 4.PA - 45/20 (32) 5.RV -45/14 6.RA - (10-14) 7.Femoral artery O2 saturation: 95% 8.Pulmonary artery O2 saturation: 66% 9.Fick CO:5.1 L/min, CI: 2.9 L/min/m2  Left Ventriculography - normal left ventricular chamber size with  anterolateral and diaphragmatic hypokinesia and EF visually estimated to  be 45-50%. This could possibly be consistent with recovering Takotsubo    cardiomyopathy.  CONCLUSIONS:  1.Nonobstructive CAD. 2.Cardiomyopathy - LV wall motion abnormality could be consistent with  recovering Takotsubo cardiomyopathy. 3.Mild to moderately elevated right heart pressures. 4.RFA, RFA access-closed with Perclose vascular suture device. 5.No complications.  05/2017 echo   Interpretation Summary A complete portable two-dimensional transthoracic echocardiogram with color flow Doppler and Spectral Doppler was performed. The study was technically difficult. Grade II moderate diastolic dysfunction; pseudonormal mitral inflow pattern. The left ventricle is normal in size. There is normal left ventricular wall thickness. The left ventricular ejection fraction is markedly reduced (30-35%). Right ventricular systolic pressure is elevated between 40-33mm Hg, consistent with moderate pulmonary hypertension. There is mild (1+) tricuspid regurgitation. The aortic valve is not well visualized, but is grossly normal. Dilated inferior vena cava suggests increased right atrial pressure.  Left Ventricle The left ventricle is normal in size. There is normal left ventricular wall thickness. The left ventricular ejection fraction is markedly reduced (30- 35%). There is anteroseptal and apical wall akinesis. Grade II moderate diastolic dysfunction; pseudonormal mitral inflow pattern.   Right Ventricle The right ventricle is grossly normal in size and function.  Atria The left and right atria are normal size. Dilated inferior vena cava suggests increased right atrial pressure.  Mitral Valve The mitral valve is grossly normal. There is trace mitral regurgitation.   Tricuspid Valve The tricuspid valve is not well visualized.Right ventricular systolic pressure is elevated between 40-37mm Hg, consistent with moderate pulmonary hypertension. There is mild (1+) tricuspid regurgitation.  Aortic Valve The aortic valve is not well visualized,  but is grossly normal. There is no aortic regurgitation present.  Pulmonic Valve The pulmonic valve is not well visualized, unable to adequately assess function.  Vessels The aortic root is normal in diameter.  Pericardium There is no pericardial effusion.  MMode/2D Measurements & Calculations IVSd: 1.1 cmLVIDd: 4.2 cm LVIDs: 3.5 cm LVPWd: 1.1 cm  _____________________________________________________________ LV mass(C)d: 160.7 gramsAo root diam: 3.4 cm  LV mass(C)dI: 91.3 grams/m2 Ao root area: 9.3 cm2 LA dimension: 3.5 cm _____________________________________________________________  EDV(sp4-el): 106.8 ml SV(MOD-sp4): 34.1 ml LVAs ap4: 24.9 cm2 LVLs ap4: 7.5 cm ESV(MOD-sp4): 67.3 ml ESV(sp4-el): 70.4 ml _____________________________________________________________  SV(sp4-el): 36.3 ml LAV (MOD-bp) Index: 19.8 ml/m2  _____________________________________________________________ LAV(MOD-bp): 34.8 ml  Doppler Measurements & Calculations MV E max vel: 142.0 cm/sec  MV dec slope:  830.8 cm/sec2  MV dec time: 0.17 sec _____________________________________________________________  TR max vel: 290.5 cm/sec RAP systole: 15.0 mmHg TR max PG: 33.8 mmHg RVSP(TR): 48.8 mmHg  ____________________________________________________________________________   Electronically signed BL:TJQZ Powers, M.D.,F.A.C.C. on 05/11/2017 02:04  PM Ordering Physician: [id number]  Other Result Information  Acute Interface, Incoming Card Results - 05/11/2017  2:05 PM EDT                                                [facility]                                             [place]  +-----------------+ +------------------------------------+     Shelba Flake:                 : :                                    Rondall Allegra,:                 : :                                    :       [place] [id number]   :                 : :                                    :    [phone]:                 : :                                    :    Fax [phone]                 : +------------------------------------+         [phone]     :                 : +-----------------+                                           Echocardiogram                                               Report +--------------------------------------------------------------------------+ :Name: Lovena Neighbours MABE  Study Date: 05/11/2017 11:33 AM  : :Patient Location: [place] ICC1^ICC1 120-01   BP: 119/70 mmHg                  : :MRN: [id number]                                                             : :DOB: 19-Nov-1940                          Height: 64 in                    : :Gender: Female                           Weight: 156 lb                   : :Race: WHITE,White or Caucasian                                            : :Age: 50 yrs                              BSA: 1.8 m2                      : :Reason For Study: ^Dyspnea                                                : :History: HTN, COPD, GERD, Asthma, HLD, Rheumatic Fever                    : :Referring Physician: Dione Housekeeper                                      : :Performed By: Rodena Medin, R.D.C.S                                     : +--------------------------------------------------------------------------+  Interpretation Summary A complete portable two-dimensional transthoracic echocardiogram with color flow Doppler and Spectral Doppler was performed. The study was technically difficult. Grade II moderate diastolic  dysfunction; pseudonormal mitral inflow pattern. The left ventricle is normal in size. There is normal left ventricular wall thickness. The left ventricular ejection fraction is markedly reduced (30-35%). Right ventricular systolic pressure is elevated between 40-67mm Hg, consistent with moderate pulmonary hypertension. There is mild (1+) tricuspid regurgitation. The aortic valve is not well visualized, but is grossly normal. Dilated inferior vena cava suggests increased right atrial pressure.  Left Ventricle The left ventricle is normal in size. There is normal left ventricular wall thickness. The left ventricular ejection fraction is markedly reduced (30- 35%). There is anteroseptal and apical wall akinesis. Grade II  moderate diastolic dysfunction; pseudonormal mitral inflow pattern.   Right Ventricle The right ventricle is grossly normal in size and function.  Atria The left and right atria are normal size. Dilated inferior vena cava suggests increased right atrial pressure.  Mitral Valve The mitral valve is grossly normal. There is trace mitral regurgitation.   Tricuspid Valve The tricuspid valve is not well visualized.Right ventricular systolic pressure is elevated between 40-54mm Hg, consistent with moderate pulmonary hypertension. There is mild (1+) tricuspid regurgitation.  Aortic Valve The aortic valve is not well visualized, but is grossly normal. There is no aortic regurgitation present.  Pulmonic Valve The pulmonic valve is not well visualized, unable to adequately assess function.  Vessels The aortic root is normal in diameter.  Pericardium There is no pericardial effusion.  MMode/2D Measurements & Calculations IVSd: 1.1 cm                        LVIDd: 4.2 cm                                     LVIDs: 3.5 cm                                     LVPWd: 1.1 cm          _____________________________________________________________ LV mass(C)d: 160.7  grams            Ao root diam: 3.4 cm  LV mass(C)dI: 91.3 grams/m2         Ao root area: 9.3 cm2                                     LA dimension: 3.5 cm         _____________________________________________________________  EDV(sp4-el): 106.8 ml               SV(MOD-sp4): 34.1 ml LVAs ap4: 24.9 cm2 LVLs ap4: 7.5 cm ESV(MOD-sp4): 67.3 ml ESV(sp4-el): 70.4 ml         _____________________________________________________________  SV(sp4-el): 36.3 ml                 LAV (MOD-bp) Index: 19.8 ml/m2          _____________________________________________________________ LAV(MOD-bp): 34.8 ml  Doppler Measurements & Calculations MV E max vel: 142.0 cm/sec                                      MV dec slope: 830.8 cm/sec2                                      MV dec time: 0.17 sec         _____________________________________________________________  TR max vel: 290.5 cm/sec             RAP systole: 15.0 mmHg TR max PG: 33.8 mmHg RVSP(TR): 48.8 mmHg    07/2017 echo Study Conclusions  - Left ventricle: The cavity size was normal. Wall thickness was   normal. Systolic function was vigorous. The estimated ejection   fraction was in the range of 65% to  70%. Wall motion was normal;   there were no regional wall motion abnormalities. Doppler   parameters are consistent with abnormal left ventricular   relaxation (grade 1 diastolic dysfunction). - Aortic valve: Mildly calcified annulus. Trileaflet; normal   thickness leaflets. Valve area (VTI): 2.62 cm^2. Valve area   (Vmax): 2.44 cm^2. Valve area (Vmean): 2.42 cm^2. - Aorta: Aortic root dimension: 43 mm (ED). - Aortic root: The aortic root was mildly dilated. - Atrial septum: No defect or patent foramen ovale was identified. - Technically adequate study.    Assessment and Plan   1. History of stress induced cardiomyopathy/Taktosubo - LVEF has now normalized, drop in LVEF in setting of pneumona earlier this year - no symptoms. We will  lower toresmide to 20mg  daily, may take additioanl 20mg  as needed for swelling - request labs from pcp   F/u 1 year.       Arnoldo Lenis, M.D.

## 2018-01-14 ENCOUNTER — Encounter: Payer: Self-pay | Admitting: *Deleted

## 2018-04-13 ENCOUNTER — Other Ambulatory Visit (HOSPITAL_COMMUNITY): Payer: Self-pay | Admitting: Pulmonary Disease

## 2018-04-13 DIAGNOSIS — R911 Solitary pulmonary nodule: Secondary | ICD-10-CM

## 2018-04-13 DIAGNOSIS — N63 Unspecified lump in unspecified breast: Secondary | ICD-10-CM

## 2018-04-14 ENCOUNTER — Other Ambulatory Visit (HOSPITAL_COMMUNITY): Payer: Self-pay | Admitting: Pulmonary Disease

## 2018-04-14 DIAGNOSIS — N63 Unspecified lump in unspecified breast: Secondary | ICD-10-CM

## 2018-04-14 DIAGNOSIS — R911 Solitary pulmonary nodule: Secondary | ICD-10-CM

## 2018-04-22 ENCOUNTER — Ambulatory Visit (HOSPITAL_COMMUNITY)
Admission: RE | Admit: 2018-04-22 | Discharge: 2018-04-22 | Disposition: A | Discharge status: Home / Self Care | Payer: Medicare Other | Source: Ambulatory Visit | Attending: Pulmonary Disease | Admitting: Pulmonary Disease

## 2018-04-22 DIAGNOSIS — C7951 Secondary malignant neoplasm of bone: Secondary | ICD-10-CM | POA: Insufficient documentation

## 2018-04-22 DIAGNOSIS — R9389 Abnormal findings on diagnostic imaging of other specified body structures: Secondary | ICD-10-CM | POA: Diagnosis not present

## 2018-04-22 DIAGNOSIS — I700 Atherosclerosis of aorta: Secondary | ICD-10-CM | POA: Insufficient documentation

## 2018-04-22 DIAGNOSIS — C787 Secondary malignant neoplasm of liver and intrahepatic bile duct: Secondary | ICD-10-CM | POA: Diagnosis not present

## 2018-04-22 DIAGNOSIS — C801 Malignant (primary) neoplasm, unspecified: Secondary | ICD-10-CM | POA: Diagnosis not present

## 2018-04-22 DIAGNOSIS — R590 Localized enlarged lymph nodes: Secondary | ICD-10-CM | POA: Diagnosis not present

## 2018-04-22 DIAGNOSIS — J9811 Atelectasis: Secondary | ICD-10-CM | POA: Insufficient documentation

## 2018-04-22 DIAGNOSIS — I2510 Atherosclerotic heart disease of native coronary artery without angina pectoris: Secondary | ICD-10-CM | POA: Insufficient documentation

## 2018-04-22 DIAGNOSIS — R911 Solitary pulmonary nodule: Secondary | ICD-10-CM

## 2018-04-22 DIAGNOSIS — I712 Thoracic aortic aneurysm, without rupture: Secondary | ICD-10-CM | POA: Diagnosis not present

## 2018-04-22 DIAGNOSIS — R918 Other nonspecific abnormal finding of lung field: Secondary | ICD-10-CM | POA: Diagnosis not present

## 2018-04-22 DIAGNOSIS — N63 Unspecified lump in unspecified breast: Secondary | ICD-10-CM

## 2018-04-22 LAB — GLUCOSE, CAPILLARY: GLUCOSE-CAPILLARY: 115 mg/dL — AB (ref 70–99)

## 2018-04-22 MED ORDER — FLUDEOXYGLUCOSE F - 18 (FDG) INJECTION
7.8000 | Freq: Once | INTRAVENOUS | Status: AC | PRN
  Administered 2018-04-22: 7.8 via INTRAVENOUS

## 2018-04-26 ENCOUNTER — Other Ambulatory Visit (HOSPITAL_COMMUNITY): Payer: Self-pay | Admitting: Pulmonary Disease

## 2018-04-26 DIAGNOSIS — K769 Liver disease, unspecified: Secondary | ICD-10-CM

## 2018-04-29 ENCOUNTER — Telehealth: Payer: Self-pay | Admitting: Hematology

## 2018-04-29 NOTE — Telephone Encounter (Signed)
Pt has been rescheduled to see Dr. Burr Medico on 3/3 at 1130am. I lft the new appt time on the pt's vm.

## 2018-04-30 ENCOUNTER — Other Ambulatory Visit: Payer: Self-pay | Admitting: Radiology

## 2018-05-03 ENCOUNTER — Encounter: Payer: Self-pay | Admitting: *Deleted

## 2018-05-03 ENCOUNTER — Other Ambulatory Visit (HOSPITAL_COMMUNITY): Payer: Self-pay

## 2018-05-03 ENCOUNTER — Telehealth: Payer: Self-pay | Admitting: *Deleted

## 2018-05-03 DIAGNOSIS — C799 Secondary malignant neoplasm of unspecified site: Secondary | ICD-10-CM

## 2018-05-03 NOTE — Telephone Encounter (Signed)
Oncology Nurse Navigator Documentation  Oncology Nurse Navigator Flowsheets 05/03/2018  Navigator Location [facility]  Referral date to RadOnc/MedOnc 04/30/2018  Navigator Encounter Type Telephone/I received referral on [name] and updated Dr. Julien Nordmann. He can see her on 05/07/2018.  I called and spoke with Ms. Metzger with an update on appt time and place. She verbalized understanding of appt time and place.   Telephone Outgoing Call  Treatment Phase Abnormal Scans  Barriers/Navigation Needs Education;Coordination of Care  Education Other  Interventions Coordination of Care;Education  Coordination of Care Appts  Education Method Verbal  Acuity Level 2  Time Spent with Patient 30

## 2018-05-03 NOTE — Progress Notes (Signed)
Patient had CT chest scan at [facility].  I called their imaging dept and they will burn a CD of scan and mail it to [place].

## 2018-05-04 ENCOUNTER — Ambulatory Visit (HOSPITAL_COMMUNITY)
Admission: RE | Admit: 2018-05-04 | Discharge: 2018-05-04 | Disposition: A | Discharge status: Home / Self Care | Payer: Medicare Other | Source: Ambulatory Visit | Attending: Pulmonary Disease | Admitting: Pulmonary Disease

## 2018-05-04 ENCOUNTER — Other Ambulatory Visit (HOSPITAL_COMMUNITY): Payer: Self-pay | Admitting: Interventional Radiology

## 2018-05-04 ENCOUNTER — Other Ambulatory Visit: Payer: Self-pay

## 2018-05-04 DIAGNOSIS — Z882 Allergy status to sulfonamides status: Secondary | ICD-10-CM | POA: Diagnosis not present

## 2018-05-04 DIAGNOSIS — K769 Liver disease, unspecified: Secondary | ICD-10-CM

## 2018-05-04 DIAGNOSIS — Z79899 Other long term (current) drug therapy: Secondary | ICD-10-CM | POA: Diagnosis not present

## 2018-05-04 DIAGNOSIS — K219 Gastro-esophageal reflux disease without esophagitis: Secondary | ICD-10-CM | POA: Diagnosis not present

## 2018-05-04 DIAGNOSIS — Z881 Allergy status to other antibiotic agents status: Secondary | ICD-10-CM | POA: Diagnosis not present

## 2018-05-04 DIAGNOSIS — J9811 Atelectasis: Secondary | ICD-10-CM | POA: Diagnosis not present

## 2018-05-04 DIAGNOSIS — Z886 Allergy status to analgesic agent status: Secondary | ICD-10-CM | POA: Diagnosis not present

## 2018-05-04 DIAGNOSIS — I509 Heart failure, unspecified: Secondary | ICD-10-CM | POA: Insufficient documentation

## 2018-05-04 DIAGNOSIS — J449 Chronic obstructive pulmonary disease, unspecified: Secondary | ICD-10-CM | POA: Insufficient documentation

## 2018-05-04 DIAGNOSIS — Z888 Allergy status to other drugs, medicaments and biological substances status: Secondary | ICD-10-CM | POA: Diagnosis not present

## 2018-05-04 DIAGNOSIS — I712 Thoracic aortic aneurysm, without rupture: Secondary | ICD-10-CM | POA: Insufficient documentation

## 2018-05-04 DIAGNOSIS — Z885 Allergy status to narcotic agent status: Secondary | ICD-10-CM | POA: Diagnosis not present

## 2018-05-04 DIAGNOSIS — C227 Other specified carcinomas of liver: Secondary | ICD-10-CM | POA: Diagnosis not present

## 2018-05-04 DIAGNOSIS — Z87891 Personal history of nicotine dependence: Secondary | ICD-10-CM | POA: Diagnosis not present

## 2018-05-04 DIAGNOSIS — Z8249 Family history of ischemic heart disease and other diseases of the circulatory system: Secondary | ICD-10-CM | POA: Diagnosis not present

## 2018-05-04 DIAGNOSIS — C7951 Secondary malignant neoplasm of bone: Secondary | ICD-10-CM | POA: Insufficient documentation

## 2018-05-04 DIAGNOSIS — C3490 Malignant neoplasm of unspecified part of unspecified bronchus or lung: Secondary | ICD-10-CM | POA: Insufficient documentation

## 2018-05-04 DIAGNOSIS — Z880 Allergy status to penicillin: Secondary | ICD-10-CM | POA: Insufficient documentation

## 2018-05-04 DIAGNOSIS — R000 Tachycardia, unspecified: Secondary | ICD-10-CM | POA: Diagnosis not present

## 2018-05-04 LAB — PROTIME-INR
INR: 0.9 (ref 0.8–1.2)
Prothrombin Time: 12.4 s (ref 11.4–15.2)

## 2018-05-04 LAB — CBC
HCT: 35.2 % — ABNORMAL LOW (ref 36.0–46.0)
Hemoglobin: 11.8 g/dL — ABNORMAL LOW (ref 12.0–15.0)
MCH: 31.2 pg (ref 26.0–34.0)
MCHC: 33.5 g/dL (ref 30.0–36.0)
MCV: 93.1 fL (ref 80.0–100.0)
Platelets: 469 10*3/uL — ABNORMAL HIGH (ref 150–400)
RBC: 3.78 MIL/uL — ABNORMAL LOW (ref 3.87–5.11)
RDW: 14.3 % (ref 11.5–15.5)
WBC: 15.4 10*3/uL — ABNORMAL HIGH (ref 4.0–10.5)
nRBC: 0 % (ref 0.0–0.2)

## 2018-05-04 MED ORDER — LIDOCAINE HCL (PF) 1 % IJ SOLN
INTRAMUSCULAR | Status: AC
  Filled 2018-05-04: qty 30

## 2018-05-04 MED ORDER — GELATIN ABSORBABLE 12-7 MM EX MISC
CUTANEOUS | Status: AC
  Filled 2018-05-04: qty 1

## 2018-05-04 MED ORDER — LORAZEPAM 2 MG/ML IJ SOLN
INTRAMUSCULAR | Status: AC
  Filled 2018-05-04: qty 1

## 2018-05-04 MED ORDER — LORAZEPAM 2 MG/ML IJ SOLN
INTRAMUSCULAR | Status: AC | PRN
  Administered 2018-05-04: 0.5 mg via INTRAVENOUS

## 2018-05-04 MED ORDER — SODIUM CHLORIDE 0.9 % IV SOLN: INTRAVENOUS | Status: DC

## 2018-05-04 NOTE — Discharge Instructions (Signed)
Liver Biopsy, Care After °These instructions give you information on caring for yourself after your procedure. Your doctor may also give you more specific instructions. Call your doctor if you have any problems or questions after your procedure. °What can I expect after the procedure? °After the procedure, it is common to have: °· Pain and soreness where the biopsy was done. °· Bruising around the area where the biopsy was done. °· Sleepiness and be tired for a few days. °Follow these instructions at home: °Medicines °· Take over-the-counter and prescription medicines only as told by your doctor. °· If you were prescribed an antibiotic medicine, take it as told by your doctor. Do not stop taking the antibiotic even if you start to feel better. °· Do not take medicines such as aspirin and ibuprofen. These medicines can thin your blood. Do not take these medicines unless your doctor tells you to take them. °· If you are taking prescription pain medicine, take actions to prevent or treat constipation. Your doctor may recommend that you: °? Drink enough fluid to keep your pee (urine) clear or pale yellow. °? Take over-the-counter or prescription medicines. °? Eat foods that are high in fiber, such as fresh fruits and vegetables, whole grains, and beans. °? Limit foods that are high in fat and processed sugars, such as fried and sweet foods. °Caring for your cut °· Follow instructions from your doctor about how to take care of your cuts from surgery (incisions). Make sure you: °? Wash your hands with soap and water before you change your bandage (dressing). If you cannot use soap and water, use hand sanitizer. °? Change your bandage as told by your doctor. °? Leave stitches (sutures), skin glue, or skin tape (adhesive) strips in place. They may need to stay in place for 2 weeks or longer. If tape strips get loose and curl up, you may trim the loose edges. Do not remove tape strips completely unless your doctor says it is  okay. °· Check your cuts every day for signs of infection. Check for: °? Redness, swelling, or more pain. °? Fluid or blood. °? Pus or a bad smell. °? Warmth. °· Do not take baths, swim, or use a hot tub until your doctor says it is okay to do so. °Activity ° °· Rest at home for 1-2 days or as told by your doctor. °? Avoid sitting for a long time without moving. Get up to take short walks every 1-2 hours. °· Return to your normal activities as told by your doctor. Ask what activities are safe for you. °· Do not do these things in the first 24 hours: °? Drive. °? Use machinery. °? Take a bath or shower. °· Do not lift more than 10 pounds (4.5 kg) or play contact sports for the first 2 weeks. °General instructions ° °· Do not drink alcohol in the first week after the procedure. °· Have someone stay with you for at least 24 hours after the procedure. °· Get your test results. Ask your doctor or the department that is doing the test: °? When will my results be ready? °? How will I get my results? °? What are my treatment options? °? What other tests do I need? °? What are my next steps? °· Keep all follow-up visits as told by your doctor. This is important. °Contact a doctor if: °· A cut bleeds and leaves more than just a small spot of blood. °· A cut is red, puffs up (  swells), or hurts more than before. °· Fluid or something else comes from a cut. °· A cut smells bad. °· You have a fever or chills. °Get help right away if: °· You have swelling, bloating, or pain in your belly (abdomen). °· You get dizzy or faint. °· You have a rash. °· You feel sick to your stomach (nauseous) or throw up (vomit). °· You have trouble breathing, feel short of breath, or feel faint. °· Your chest hurts. °· You have problems talking or seeing. °· You have trouble with your balance or moving your arms or legs. °Summary °· After the procedure, it is common to have pain, soreness, bruising, and tiredness. °· Your doctor will tell you how to  take care of yourself at home. Change your bandage, take your medicines, and limit your activities as told by your doctor. °· Call your doctor if you have symptoms of infection. Get help right away if your belly swells, your cut bleeds a lot, or you have trouble talking or breathing. °This information is not intended to replace advice given to you by your health care provider. Make sure you discuss any questions you have with your health care provider. °Document Released: 11/27/2007 Document Revised: 02/27/2017 Document Reviewed: 02/27/2017 °Elsevier Interactive Patient Education © 2019 [employer] °Moderate Conscious Sedation, Adult, Care After °These instructions provide you with information about caring for yourself after your procedure. Your health care provider may also give you more specific instructions. Your treatment has been planned according to current medical practices, but problems sometimes occur. Call your health care provider if you have any problems or questions after your procedure. °What can I expect after the procedure? °After your procedure, it is common: °· To feel sleepy for several hours. °· To feel clumsy and have poor balance for several hours. °· To have poor judgment for several hours. °· To vomit if you eat too soon. °Follow these instructions at home: °For at least 24 hours after the procedure: ° °· Do not: °? Participate in activities where you could fall or become injured. °? Drive. °? Use heavy machinery. °? Drink alcohol. °? Take sleeping pills or medicines that cause drowsiness. °? Make important decisions or sign legal documents. °? Take care of children on your own. °· Rest. °Eating and drinking °· Follow the diet recommended by your health care provider. °· If you vomit: °? Drink water, juice, or soup when you can drink without vomiting. °? Make sure you have little or no nausea before eating solid foods. °General instructions °· Have a responsible adult stay with you until  you are awake and alert. °· Take over-the-counter and prescription medicines only as told by your health care provider. °· If you smoke, do not smoke without supervision. °· Keep all follow-up visits as told by your health care provider. This is important. °Contact a health care provider if: °· You keep feeling nauseous or you keep vomiting. °· You feel light-headed. °· You develop a rash. °· You have a fever. °Get help right away if: °· You have trouble breathing. °This information is not intended to replace advice given to you by your health care provider. Make sure you discuss any questions you have with your health care provider. °Document Released: 12/08/2012 Document Revised: 07/23/2015 Document Reviewed: 06/09/2015 °Elsevier Interactive Patient Education © 2019 [employer] ° °

## 2018-05-04 NOTE — H&P (Addendum)
Chief Complaint: Patient was seen in consultation today for liver lesion biopsy at the request of Hawkins,Edward  Referring Physician(s): Hawkins,Edward  Supervising Physician: Daryll Brod  Patient Status: [facility] - Out-pt  History of Present Illness: [name] is a 78 y.o. female   Known CHF Pt with recent abnormal CXR Imaging in work up reveals pulmonary nodules Lymphadenopathy; liver lesions and bony metastasis  CT 04/22/18: IMPRESSION: 1. Extensive hypermetabolic adenopathy is identified within the mediastinum and right hilar region. Imaging findings are concerning for primary bronchogenic carcinoma. 2. Multifocal hypermetabolic liver metastases. 3. 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 4. Atelectasis in the right middle lobe and postinflammatory changes in the posterior left base are identified. A few scattered nodules are identified in both lungs which exhibit mild FDG uptake. Pulmonary metastasis not excluded. 5. 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 Thoracic surgery consultation may be considered. 6. Coronary artery calcifications 7. Age-indeterminate T9 compression deformity with nondisplaced fractures involving the spinous processes of T7, T8 and T9.  Scheduled for liver lesion biopsy Scheduled to see Dr Burr Medico Friday  Past Medical History:  Diagnosis Date  . COPD (chronic obstructive pulmonary disease) ([place])   . GERD (gastroesophageal reflux disease)   . Tachycardia     Past Surgical History:  Procedure Laterality Date  . APPENDECTOMY    . arthroscopc     total knee  . PARTIAL HYSTERECTOMY    . ruptured disk      Allergies: Morphine; Celecoxib; Ciprofloxacin; Codeine; Fentanyl; Hydrochlorothiazide; Iodine; Levaquin  [levofloxacin in d5w]; Lisinopril; Losartan; Midazolam; Penicillins; Statins; Atorvastatin; Citalopram; Colesevelam;  Fenofibrate; Tramadol hcl; Aleve [naproxen sodium]; Azithromycin; Cyclobenzaprine; Erythromycin; Fosamax [alendronate]; Meloxicam; Moxifloxacin; and Sulfonamide derivatives  Medications: Prior to Admission medications   Medication Sig Start Date End Date Taking? Authorizing Provider  albuterol (PROVENTIL HFA;VENTOLIN HFA) 108 (90 Base) MCG/ACT inhaler Inhale into the lungs every 6 (six) hours as needed for wheezing or shortness of breath.   Yes [provider]  azelastine (OPTIVAR) 0.05 % ophthalmic solution Place 1 drop into both eyes 2 (two) times daily. 10/17/15  Yes Bobbitt, [place] Muta, MD  Azelastine HCl 0.15 % SOLN 1-2 sprays each nostril 2 times per day as needed. 10/16/15  Yes Bobbitt, [place] Muta, MD  budesonide-formoterol [facility]) 160-4.5 MCG/ACT inhaler Inhale 2 puffs into the lungs 2 (two) times daily.   Yes [provider]  carvedilol (COREG) 6.25 MG tablet Take 1 tablet (6.25 mg total) by mouth 2 (two) times daily with a meal. 01/12/18  Yes Branch, Alphonse Guild, MD  gabapentin (NEURONTIN) 300 MG capsule Take 300 mg by mouth 2 (two) times daily.  09/25/15  Yes [provider]  ipratropium (ATROVENT) 0.02 % nebulizer solution Take 0.5 mg by nebulization 4 (four) times daily as needed for wheezing or shortness of breath.   Yes [provider]  losartan (COZAAR) 25 MG tablet Take 1 tablet (25 mg total) by mouth daily. 01/12/18  Yes BranchAlphonse Guild, MD  pantoprazole (PROTONIX) 40 MG tablet Take 40 mg by mouth daily. 09/06/15  Yes [provider]  pravastatin (PRAVACHOL) 80 MG tablet TAKE ONE TABLET BY MOUTH ONCE DAILY IN THE EVENING 06/21/15  Yes [provider]  torsemide (DEMADEX) 20 MG tablet TAKE 20 MG DAILY MAY TAKE ADDITIONAL 20 MG AS NEEDED FOR SWELLING 01/12/18  Yes Branch, Alphonse Guild, MD     Family History  Problem Relation Age of Onset  .  Coronary artery disease Unknown     Social History   Socioeconomic History  .  Marital status: Married    Spouse name: Not on file  . Number of children: Not on file  . Years of education: Not on file  . Highest education level: Not on file  Occupational History  . Not on file  Social Needs  . Financial resource strain: Not on file  . Food insecurity:    Worry: Not on file    Inability: Not on file  . Transportation needs:    Medical: Not on file    Non-medical: Not on file  Tobacco Use  . Smoking status: Former Smoker    Last attempt to quit: 10/16/2011    Years since quitting: 6.5  . Smokeless tobacco: Never Used  Substance and Sexual Activity  . Alcohol use: No  . Drug use: No  . Sexual activity: Not on file  Lifestyle  . Physical activity:    Days per week: Not on file    Minutes per session: Not on file  . Stress: Not on file  Relationships  . Social connections:    Talks on phone: Not on file    Gets together: Not on file    Attends religious service: Not on file    Active member of club or organization: Not on file    Attends meetings of clubs or organizations: Not on file    Relationship status: Not on file  Other Topics Concern  . Not on file  Social History Narrative  . Not on file     Review of Systems: A 12 point ROS discussed and pertinent positives are indicated in the HPI above.  All other systems are negative.  Review of Systems  Constitutional: Positive for fatigue. Negative for activity change, appetite change and fever.  Respiratory: Positive for wheezing. Negative for shortness of breath.   Cardiovascular: Negative for chest pain.  Musculoskeletal: Positive for back pain.  Neurological: Positive for weakness.  Psychiatric/Behavioral: Negative for behavioral problems and confusion.    Vital Signs: BP (!) 157/97   Pulse 91   Temp (!) 97.3 F (36.3 C) (Oral)   LMP  (LMP Unknown) Comment: Menopause  SpO2 93%   Physical Exam Vitals signs reviewed.  Cardiovascular:     Rate and Rhythm: Normal rate and regular  rhythm.     Heart sounds: Normal heart sounds.  Pulmonary:     Breath sounds: Wheezing present.  Abdominal:     Palpations: Abdomen is soft.  Musculoskeletal: Normal range of motion.  Skin:    General: Skin is warm and dry.  Neurological:     Mental Status: She is alert and oriented to person, place, and time.  Psychiatric:        Mood and Affect: Mood normal.        Behavior: Behavior normal.        Thought Content: Thought content normal.        Judgment: Judgment normal.     Imaging: Nm Pet Image Initial (pi) Skull Base To Thigh  Result Date: 04/22/2018 CLINICAL DATA:  Initial treatment strategy for lung cancer. EXAM: NUCLEAR MEDICINE PET SKULL BASE TO THIGH TECHNIQUE: 7.8 mCi F-18 FDG was injected intravenously. Full-ring PET imaging was performed from the skull base to thigh after the radiotracer. CT data was obtained and used for attenuation correction and anatomic localization. Fasting blood glucose: 115 mg/dl COMPARISON:  CT chest 04/07/18 FINDINGS: Mediastinal blood pool activity: SUV  max 2.74. NECK: FDG positive nodule in the right lobe of thyroid gland is identified within SUV max of 4.22. No hypermetabolic cervical lymph nodes. Incidental CT findings: none CHEST: No hypermetabolic axillary or supraclavicular lymph nodes. Multiple hypermetabolic mediastinal and hilar lymph nodes are identified. -index right paratracheal lymph node measures 1.4 cm within SUV max of 14.9. -index subcarinal lymph node measures 3.7 cm within SUV max of 15.0. -index right hilar node measures 3.6 cm within SUV max of 17.07. Azygos esophageal lymph node between the IVC and distal descending thoracic aorta measures 2.3 cm and has an SUV max of 14.2. There is no pleural effusion identified bilaterally. -index 5 mm nodule in the left upper lobe is identified within SUV max of 1.69. Ovoid nodule within the posteromedial right base measures 1.5 cm and has an SUV max of 4.29. Small peripheral nodule within the  lateral right lower lobe measures 6 mm within SUV max of 0.9. Incidental CT findings: Moderate to advanced changes of centrilobular emphysema with diffuse bronchial wall thickening. Extensive aortic atherosclerosis. 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 Tortuous descending thoracic aorta is also aneurysmally dilated measuring 4.2 cm in diameter. Coronary artery calcifications. There is chronic appearing subsegmental atelectasis involving the right middle lobe. Bronchiectasis and patchy peripheral airspace consolidation with tree-in-bud nodularity is identified within the posterior left lung base. Scattered small pulmonary nodules are identified. ABDOMEN/PELVIS: Multifocal hypermetabolic liver lesions are identified compatible with metastatic disease. Lesions involve both lobes of liver. -index lesion within posterior left lobe measures 2 cm and has an SUV max of 9.52. -Index lesion within the posterior right lobe of liver measures 1.9 cm within SUV max of 5.6. -Medial right lobe of liver metastasis measures 1.9 cm within SUV max of 7.21. No abnormal uptake identified within the liver or spleen. No abnormal uptake within the adrenal glands. No hypermetabolic lymph nodes within the abdomen or pelvis. Incidental CT findings: Extensive aortic atherosclerosis. The infrarenal abdominal aorta measures 2.9 cm in maximum AP dimension. Bilateral fat containing inguinal hernias identified. SKELETON: Multifocal osseous metastases identified. These are best seen on the PET images and are essentially occult on corresponding CT images. -There is diffuse uptake identified within the lower sacrum which has an SUV max of 6.13. -More focal uptake within the posterior right iliac bone has an SUV max of 11.39. -Right lateral iliac wing lesion has an SUV max of 6.71. -Left lateral rib lesion is noted within SUV max of 3.92. Incidental CT findings: T9 compression deformity within the lower thoracic spine is again  noted. There are associated fracture deformities extending through the spinous processes of T7, T8 and T9. IMPRESSION: 1. Extensive hypermetabolic adenopathy is identified within the mediastinum and right hilar region. Imaging findings are concerning for primary bronchogenic carcinoma. 2. Multifocal hypermetabolic liver metastases. 3. 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 4. Atelectasis in the right middle lobe and postinflammatory changes in the posterior left base are identified. A few scattered nodules are identified in both lungs which exhibit mild FDG uptake. Pulmonary metastasis not excluded. 5. 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 Thoracic surgery consultation may be considered. 6. Coronary artery calcifications 7. Age-indeterminate T9 compression deformity with nondisplaced fractures involving the spinous processes of T7, T8 and T9. Aortic Atherosclerosis (ICD10-I70.0) and Emphysema (ICD10-J43.9). These results will be called to the ordering clinician or representative by the Radiologist Assistant, and communication documented in the PACS or zVision Dashboard. Electronically  Signed   By: Kerby Moors M.D.   On: 04/22/2018 17:04    Labs:  CBC: Recent Labs    05/04/18 1054  WBC 15.4*  HGB 11.8*  HCT 35.2*  PLT 469*    COAGS: Recent Labs    05/04/18 1054  INR 0.9    BMP: No results for input(s): NA, K, CL, CO2, GLUCOSE, BUN, CALCIUM, CREATININE, GFRNONAA, GFRAA in the last 8760 hours.  Invalid input(s): CMP  LIVER FUNCTION TESTS: No results for input(s): BILITOT, AST, ALT, ALKPHOS, PROT, ALBUMIN in the last 8760 hours.  TUMOR MARKERS: No results for input(s): AFPTM, CEA, CA199, CHROMGRNA in the last 8760 hours.  Assessment and Plan:  New findings on CXR Work up reveals +PET liver lesion; LAN and bony mets Now scheduled for liver lesion biopsy Risks and  benefits of liver lesion biopsy was discussed with the patient and/or patient's family including, but not limited to bleeding, infection, damage to adjacent structures or low yield requiring additional tests.  All of the questions were answered and there is agreement to proceed. Consent signed and in chart.  Thank you for this interesting consult.  I greatly enjoyed meeting [name] and look forward to participating in their care.  A copy of this report was sent to the requesting provider on this date.  Electronically Signed: Lavonia Drafts, PA-C 05/04/2018, 12:12 PM   I spent a total of  30 Minutes   in face to face in clinical consultation, greater than 50% of which was counseling/coordinating care for liver lesion biopsy

## 2018-05-04 NOTE — Procedures (Signed)
Met lung ca  S/p [place] LEFT LIVER MET BX  No comp Stable ebl min Path pending Full report in pacs

## 2018-05-07 ENCOUNTER — Telehealth: Payer: Self-pay | Admitting: Internal Medicine

## 2018-05-07 ENCOUNTER — Inpatient Hospital Stay: Payer: Medicare Other | Attending: Internal Medicine

## 2018-05-07 ENCOUNTER — Inpatient Hospital Stay (HOSPITAL_BASED_OUTPATIENT_CLINIC_OR_DEPARTMENT_OTHER): Payer: Medicare Other | Admitting: Internal Medicine

## 2018-05-07 VITALS — BP 138/75 | HR 77 | Temp 98.4°F | Resp 19 | Ht 64.0 in | Wt 143.9 lb

## 2018-05-07 DIAGNOSIS — C342 Malignant neoplasm of middle lobe, bronchus or lung: Secondary | ICD-10-CM

## 2018-05-07 DIAGNOSIS — Z9981 Dependence on supplemental oxygen: Secondary | ICD-10-CM | POA: Insufficient documentation

## 2018-05-07 DIAGNOSIS — Z5189 Encounter for other specified aftercare: Secondary | ICD-10-CM | POA: Diagnosis present

## 2018-05-07 DIAGNOSIS — C7951 Secondary malignant neoplasm of bone: Secondary | ICD-10-CM | POA: Insufficient documentation

## 2018-05-07 DIAGNOSIS — R5383 Other fatigue: Secondary | ICD-10-CM | POA: Insufficient documentation

## 2018-05-07 DIAGNOSIS — Z87891 Personal history of nicotine dependence: Secondary | ICD-10-CM | POA: Diagnosis not present

## 2018-05-07 DIAGNOSIS — R2232 Localized swelling, mass and lump, left upper limb: Secondary | ICD-10-CM | POA: Insufficient documentation

## 2018-05-07 DIAGNOSIS — R000 Tachycardia, unspecified: Secondary | ICD-10-CM | POA: Diagnosis not present

## 2018-05-07 DIAGNOSIS — R531 Weakness: Secondary | ICD-10-CM | POA: Diagnosis not present

## 2018-05-07 DIAGNOSIS — R0682 Tachypnea, not elsewhere classified: Secondary | ICD-10-CM | POA: Diagnosis not present

## 2018-05-07 DIAGNOSIS — Z5112 Encounter for antineoplastic immunotherapy: Secondary | ICD-10-CM | POA: Insufficient documentation

## 2018-05-07 DIAGNOSIS — Z5111 Encounter for antineoplastic chemotherapy: Secondary | ICD-10-CM | POA: Insufficient documentation

## 2018-05-07 DIAGNOSIS — I491 Atrial premature depolarization: Secondary | ICD-10-CM | POA: Diagnosis not present

## 2018-05-07 DIAGNOSIS — J449 Chronic obstructive pulmonary disease, unspecified: Secondary | ICD-10-CM | POA: Diagnosis not present

## 2018-05-07 DIAGNOSIS — Z806 Family history of leukemia: Secondary | ICD-10-CM

## 2018-05-07 DIAGNOSIS — C3491 Malignant neoplasm of unspecified part of right bronchus or lung: Secondary | ICD-10-CM

## 2018-05-07 DIAGNOSIS — J9811 Atelectasis: Secondary | ICD-10-CM | POA: Diagnosis not present

## 2018-05-07 DIAGNOSIS — C787 Secondary malignant neoplasm of liver and intrahepatic bile duct: Secondary | ICD-10-CM

## 2018-05-07 DIAGNOSIS — R197 Diarrhea, unspecified: Secondary | ICD-10-CM | POA: Insufficient documentation

## 2018-05-07 DIAGNOSIS — Z7189 Other specified counseling: Secondary | ICD-10-CM

## 2018-05-07 DIAGNOSIS — Z8679 Personal history of other diseases of the circulatory system: Secondary | ICD-10-CM

## 2018-05-07 DIAGNOSIS — R05 Cough: Secondary | ICD-10-CM | POA: Diagnosis not present

## 2018-05-07 DIAGNOSIS — T80818A Extravasation of other vesicant agent, initial encounter: Secondary | ICD-10-CM | POA: Insufficient documentation

## 2018-05-07 DIAGNOSIS — Z79899 Other long term (current) drug therapy: Secondary | ICD-10-CM | POA: Diagnosis not present

## 2018-05-07 DIAGNOSIS — I110 Hypertensive heart disease with heart failure: Secondary | ICD-10-CM | POA: Insufficient documentation

## 2018-05-07 DIAGNOSIS — Z8249 Family history of ischemic heart disease and other diseases of the circulatory system: Secondary | ICD-10-CM

## 2018-05-07 DIAGNOSIS — L539 Erythematous condition, unspecified: Secondary | ICD-10-CM | POA: Diagnosis not present

## 2018-05-07 DIAGNOSIS — C799 Secondary malignant neoplasm of unspecified site: Secondary | ICD-10-CM

## 2018-05-07 DIAGNOSIS — R590 Localized enlarged lymph nodes: Secondary | ICD-10-CM | POA: Diagnosis not present

## 2018-05-07 DIAGNOSIS — Z8709 Personal history of other diseases of the respiratory system: Secondary | ICD-10-CM

## 2018-05-07 DIAGNOSIS — Z8719 Personal history of other diseases of the digestive system: Secondary | ICD-10-CM

## 2018-05-07 DIAGNOSIS — I509 Heart failure, unspecified: Secondary | ICD-10-CM | POA: Diagnosis not present

## 2018-05-07 DIAGNOSIS — M6281 Muscle weakness (generalized): Secondary | ICD-10-CM

## 2018-05-07 LAB — COMPREHENSIVE METABOLIC PANEL
ALT: 18 U/L (ref 0–44)
AST: 34 U/L (ref 15–41)
Albumin: 3.4 g/dL — ABNORMAL LOW (ref 3.5–5.0)
Alkaline Phosphatase: 98 U/L (ref 38–126)
Anion gap: 10 (ref 5–15)
BUN: 18 mg/dL (ref 8–23)
CALCIUM: 8.5 mg/dL — AB (ref 8.9–10.3)
CO2: 30 mmol/L (ref 22–32)
Chloride: 84 mmol/L — ABNORMAL LOW (ref 98–111)
Creatinine, Ser: 0.85 mg/dL (ref 0.44–1.00)
GFR calc non Af Amer: >60 mL/min (ref >60)
Glucose, Bld: 97 mg/dL (ref 70–99)
Potassium: 3.5 mmol/L (ref 3.5–5.1)
Sodium: 124 mmol/L — ABNORMAL LOW (ref 135–145)
Total Bilirubin: 0.5 mg/dL (ref 0.3–1.2)
Total Protein: 6.1 g/dL — ABNORMAL LOW (ref 6.5–8.1)

## 2018-05-07 LAB — CBC WITH DIFFERENTIAL (CANCER CENTER ONLY)
Abs Immature Granulocytes: 0.16 10*3/uL — ABNORMAL HIGH (ref 0.00–0.07)
Basophils Absolute: 0.2 10*3/uL — ABNORMAL HIGH (ref 0.0–0.1)
Basophils Relative: 2 %
Eosinophils Absolute: 0.4 10*3/uL (ref 0.0–0.5)
Eosinophils Relative: 3 %
HEMATOCRIT: 33.1 % — AB (ref 36.0–46.0)
Hemoglobin: 11.1 g/dL — ABNORMAL LOW (ref 12.0–15.0)
Immature Granulocytes: 1 %
LYMPHS ABS: 1.3 10*3/uL (ref 0.7–4.0)
Lymphocytes Relative: 9 %
MCH: 30.7 pg (ref 26.0–34.0)
MCHC: 33.5 g/dL (ref 30.0–36.0)
MCV: 91.7 fL (ref 80.0–100.0)
MONO ABS: 1.2 10*3/uL — AB (ref 0.1–1.0)
Monocytes Relative: 8 %
Neutro Abs: 11.2 10*3/uL — ABNORMAL HIGH (ref 1.7–7.7)
Neutrophils Relative %: 77 %
Platelet Count: 416 10*3/uL — ABNORMAL HIGH (ref 150–400)
RBC: 3.61 MIL/uL — ABNORMAL LOW (ref 3.87–5.11)
RDW: 13.9 % (ref 11.5–15.5)
WBC Count: 14.4 10*3/uL — ABNORMAL HIGH (ref 4.0–10.5)
nRBC: 0 % (ref 0.0–0.2)

## 2018-05-07 NOTE — Progress Notes (Signed)
[place] [phone]   [phone]  CONSULT NOTE  REFERRING PHYSICIAN: Dr. Sinda Du  REASON FOR CONSULTATION:  78 years old white female recently diagnosed with lung cancer.  HPI [name] is a 78 y.o. female with past medical history significant for COPD, GERD, tachycardia, congestive heart failure, hypertension as well as long history of smoking but quit 7 years ago.  The patient mentions that she has been complaining of cough for several weeks.  She was seen by her primary care physician and chest x-ray performed on March 04, 2018.  It showed persistent atelectasis of portion of the right middle lobe with underlying COPD.  Patient was treated with a course of antibiotics with no improvement in her condition.  This was followed by CT scan of the chest on 04/08/2018 and that showed complete right middle lobe atelectasis with fullness of the right hilum.  There was also subcarinal lymphadenopathy with conglomerate of lymph nodes measuring 2.7 cm with probable mild bilateral hilar lymphadenopathy.  There was soft tissue thickening along the right hand side of the distal esophagus questionable for soft tissue mass or a lymph node which measured 2.8 cm.  The scan also showed 1.7 cm possible mass in the left breast upper outer quadrant with multiple healed or healing left-sided ribs and compression fracture of T9 vertebral body, chronic with associated exaggerated thoracic kyphosis.  On April 22, 2018 the patient had a PET scan performed and that showed extensive hypermetabolic adenopathy identified within the mediastinum and right hilar region.  The imaging findings are concerning for primary bronchogenic carcinoma.  There was multifocal hypermetabolic liver metastasis as well as multifocal hypermetabolic bone metastasis predominantly in the bony pelvis.  There was also atelectasis in the right middle lobe and postinflammatory changes in the posterior  left base.  On May 04, 2018 the patient underwent ultrasound-guided core biopsy of 1 of the liver lesion by interventional radiology and the final pathology ([id number]) consistent with small cell carcinoma. The tumor is positive with CD56, cytokeratin AE1/AE3, chromogranin, synaptophysin and TTF-1 and negative with cytokeratin 5/6. 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 The patient was referred to me today for evaluation and recommendation regarding treatment of her condition. When seen today she continues to complain of fatigue as well as shortness of breath at baseline increased with exertion and cough with no significant chest pain or hemoptysis.  She denied having any weight loss or night sweats.  She has no nausea, vomiting but has few episodes of diarrhea with no constipation or abdominal pain.  She denied having any headache or visual changes. Family history significant for mother and father with heart disease, brother had leukemia The patient is married and has 2 sons.  She was accompanied today by her husband Warner Mccreedy.  She used to work in [employer].  She has a history of smoking 1 pack/day for around 50 years and quit 7 years ago.  She has no history of alcohol or drug abuse.    HPI  Past Medical History:  Diagnosis Date  . COPD (chronic obstructive pulmonary disease) ([place])   . GERD (gastroesophageal reflux disease)   . Tachycardia     Past Surgical History:  Procedure Laterality Date  . APPENDECTOMY    . arthroscopc     total knee  . PARTIAL HYSTERECTOMY    . ruptured disk      Family History  Problem Relation Age of Onset  .  Coronary artery disease Unknown     Social History Social History   Tobacco Use  . Smoking status: Former Smoker    Last attempt to quit: 10/16/2011    Years since quitting: 6.5  . Smokeless tobacco: Never Used  Substance Use Topics  . Alcohol use: No  . Drug use: No    Allergies  Allergen Reactions    . Morphine Shortness Of Breath, Swelling and Rash  . Celecoxib Other (See Comments) and Rash    Makes stomach burn  . Ciprofloxacin Rash    Other reaction(s): Unknown  . Codeine Rash  . Fentanyl Rash  . Hydrochlorothiazide     Other reaction(s): Dizziness  . Iodine Other (See Comments) and Rash    Betadine blisters  . Levaquin  [Levofloxacin In D5w] Rash  . Lisinopril     Other reaction(s): Dizziness  . Losartan     Other reaction(s): Dizziness  . Midazolam Rash  . Penicillins Rash  . Statins     Other reaction(s): Dizziness  . Atorvastatin   . Citalopram   . Colesevelam   . Fenofibrate   . Tramadol Hcl   . Aleve [Naproxen Sodium] Rash  . Azithromycin Rash  . Cyclobenzaprine Rash  . Erythromycin Rash  . Fosamax [Alendronate] Rash  . Meloxicam Other (See Comments)    Makes stomach burn  . Moxifloxacin Rash  . Sulfonamide Derivatives Rash    Current Outpatient Medications  Medication Sig Dispense Refill  . albuterol (PROVENTIL HFA;VENTOLIN HFA) 108 (90 Base) MCG/ACT inhaler Inhale into the lungs every 6 (six) hours as needed for wheezing or shortness of breath.    Marland Kitchen azelastine (OPTIVAR) 0.05 % ophthalmic solution Place 1 drop into both eyes 2 (two) times daily. 6 mL 12  . Azelastine HCl 0.15 % SOLN 1-2 sprays each nostril 2 times per day as needed. 30 mL 5  . budesonide-formoterol (SYMBICORT) 160-4.5 MCG/ACT inhaler Inhale 2 puffs into the lungs 2 (two) times daily.    . carvedilol (COREG) 6.25 MG tablet Take 1 tablet (6.25 mg total) by mouth 2 (two) times daily with a meal. 180 tablet 3  . gabapentin (NEURONTIN) 300 MG capsule Take 300 mg by mouth 2 (two) times daily.   2  . ipratropium (ATROVENT) 0.02 % nebulizer solution Take 0.5 mg by nebulization 4 (four) times daily as needed for wheezing or shortness of breath.    . losartan (COZAAR) 25 MG tablet Take 1 tablet (25 mg total) by mouth daily. 90 tablet 3  . pantoprazole (PROTONIX) 40 MG tablet Take 40 mg by mouth  daily.    . pravastatin (PRAVACHOL) 80 MG tablet TAKE ONE TABLET BY MOUTH ONCE DAILY IN THE EVENING    . torsemide (DEMADEX) 20 MG tablet TAKE 20 MG DAILY MAY TAKE ADDITIONAL 20 MG AS NEEDED FOR SWELLING 180 tablet 1   No current facility-administered medications for this visit.     Review of Systems  Constitutional: positive for fatigue Eyes: negative Ears, nose, mouth, throat, and face: negative Respiratory: positive for cough and dyspnea on exertion Cardiovascular: negative Gastrointestinal: negative Genitourinary:negative Integument/breast: negative Hematologic/lymphatic: negative Musculoskeletal:positive for muscle weakness Neurological: negative Behavioral/Psych: negative Endocrine: negative Allergic/Immunologic: negative  Physical Exam  [id number], healthy, no distress, well nourished and well developed SKIN: skin color, texture, turgor are normal, no rashes or significant lesions HEAD: Normocephalic, No masses, lesions, tenderness or abnormalities EYES: normal, PERRLA, Conjunctiva are pink and non-injected EARS: External ears normal, Canals clear OROPHARYNX:no exudate, no erythema and  lips, buccal mucosa, and tongue normal  NECK: supple, no adenopathy, no JVD LYMPH:  no palpable lymphadenopathy, no hepatosplenomegaly BREAST:not examined LUNGS: expiratory wheezes bilaterally HEART: regular rate & rhythm, no murmurs and no gallops ABDOMEN:abdomen soft, non-tender, normal bowel sounds and no masses or organomegaly BACK: Back symmetric, no curvature., No CVA tenderness EXTREMITIES:less then 2 second capillary refill, no joint deformities, effusion, or inflammation  NEURO: alert & oriented x 3 with fluent speech, no focal motor/sensory deficits  PERFORMANCE STATUS: ECOG 1-2  LABORATORY DATA: Lab Results  Component Value Date   WBC 14.4 (H) 05/07/2018   HGB 11.1 (L) 05/07/2018   HCT 33.1 (L) 05/07/2018   MCV 91.7 05/07/2018   PLT 416 (H) 05/07/2018       Chemistry   No results found for: NA, K, CL, CO2, BUN, CREATININE, GLU No results found for: CALCIUM, ALKPHOS, AST, ALT, BILITOT     RADIOGRAPHIC STUDIES: Nm Pet Image Initial (pi) Skull Base To Thigh  Result Date: 04/22/2018 CLINICAL DATA:  Initial treatment strategy for lung cancer. EXAM: NUCLEAR MEDICINE PET SKULL BASE TO THIGH TECHNIQUE: 7.8 mCi F-18 FDG was injected intravenously. Full-ring PET imaging was performed from the skull base to thigh after the radiotracer. CT data was obtained and used for attenuation correction and anatomic localization. Fasting blood glucose: 115 mg/dl COMPARISON:  CT chest 04/07/18 FINDINGS: Mediastinal blood pool activity: SUV max 2.74. NECK: FDG positive nodule in the right lobe of thyroid gland is identified within SUV max of 4.22. No hypermetabolic cervical lymph nodes. Incidental CT findings: none CHEST: No hypermetabolic axillary or supraclavicular lymph nodes. Multiple hypermetabolic mediastinal and hilar lymph nodes are identified. -index right paratracheal lymph node measures 1.4 cm within SUV max of 14.9. -index subcarinal lymph node measures 3.7 cm within SUV max of 15.0. -index right hilar node measures 3.6 cm within SUV max of 17.07. Azygos esophageal lymph node between the IVC and distal descending thoracic aorta measures 2.3 cm and has an SUV max of 14.2. There is no pleural effusion identified bilaterally. -index 5 mm nodule in the left upper lobe is identified within SUV max of 1.69. Ovoid nodule within the posteromedial right base measures 1.5 cm and has an SUV max of 4.29. Small peripheral nodule within the lateral right lower lobe measures 6 mm within SUV max of 0.9. Incidental CT findings: Moderate to advanced changes of centrilobular emphysema with diffuse bronchial wall thickening. Extensive aortic atherosclerosis. 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 Tortuous descending thoracic aorta is also aneurysmally dilated measuring 4.2 cm  in diameter. Coronary artery calcifications. There is chronic appearing subsegmental atelectasis involving the right middle lobe. Bronchiectasis and patchy peripheral airspace consolidation with tree-in-bud nodularity is identified within the posterior left lung base. Scattered small pulmonary nodules are identified. ABDOMEN/PELVIS: Multifocal hypermetabolic liver lesions are identified compatible with metastatic disease. Lesions involve both lobes of liver. -index lesion within posterior left lobe measures 2 cm and has an SUV max of 9.52. -Index lesion within the posterior right lobe of liver measures 1.9 cm within SUV max of 5.6. -Medial right lobe of liver metastasis measures 1.9 cm within SUV max of 7.21. No abnormal uptake identified within the liver or spleen. No abnormal uptake within the adrenal glands. No hypermetabolic lymph nodes within the abdomen or pelvis. Incidental CT findings: Extensive aortic atherosclerosis. The infrarenal abdominal aorta measures 2.9 cm in maximum AP dimension. Bilateral fat containing inguinal hernias identified. SKELETON: Multifocal osseous metastases identified. These are best seen  on the PET images and are essentially occult on corresponding CT images. -There is diffuse uptake identified within the lower sacrum which has an SUV max of 6.13. -More focal uptake within the posterior right iliac bone has an SUV max of 11.39. -Right lateral iliac wing lesion has an SUV max of 6.71. -Left lateral rib lesion is noted within SUV max of 3.92. Incidental CT findings: T9 compression deformity within the lower thoracic spine is again noted. There are associated fracture deformities extending through the spinous processes of T7, T8 and T9. IMPRESSION: 1. Extensive hypermetabolic adenopathy is identified within the mediastinum and right hilar region. Imaging findings are concerning for primary bronchogenic carcinoma. 2. Multifocal hypermetabolic liver metastases. 3. Multifocal  hypermetabolic bone metastasis predominantly involving the bony pelvis. 4. Atelectasis in the right middle lobe and postinflammatory changes in the posterior left base are identified. A few scattered nodules are identified in both lungs which exhibit mild FDG uptake. Pulmonary metastasis not excluded. 5. 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 Thoracic surgery consultation may be considered. 6. Coronary artery calcifications 7. Age-indeterminate T9 compression deformity with nondisplaced fractures involving the spinous processes of T7, T8 and T9. Aortic Atherosclerosis (ICD10-I70.0) and Emphysema (ICD10-J43.9). These results will be called to the ordering clinician or representative by the Radiologist Assistant, and communication documented in the PACS or zVision Dashboard. Electronically Signed   By: Kerby Moors M.D.   On: 04/22/2018 17:04   US Biopsy (liver)  Result Date: 05/04/2018 INDICATION: Metastatic lung cancer, liver metastases EXAM: ULTRASOUND GUIDED CORE BIOPSY OF LEFT LIVER METASTASIS MEDICATIONS: 1% lidocaine local ANESTHESIA/SEDATION: 0.5 mg Ativan moderate Sedation Time:  10 minutes The patient was continuously monitored during the procedure by the interventional radiology nurse under my direct supervision. FLUOROSCOPY TIME:  Fluoroscopy Time: None. COMPLICATIONS: None immediate. PROCEDURE: The procedure, risks, benefits, and alternatives were explained to the patient. Questions regarding the procedure were encouraged and answered. The patient understands and consents to the procedure. Previous imaging reviewed. Preliminary ultrasound performed. Hypoechoic solid lesions in the left hepatic lobe were localized through a subxiphoid window. Overlying skin marked. Under sterile conditions and local anesthesia, a 17 gauge 6.8 cm access needle was advanced from a subxiphoid approach to a left hepatic lesion. Needle position confirmed  with ultrasound. 3 18 gauge core biopsies obtained through the lesion. Images obtained for documentation. Postprocedure imaging demonstrates no hemorrhage or hematoma. Patient tolerated biopsy well. FINDINGS: Imaging confirms needle placed into a left hepatic metastasis core biopsy. IMPRESSION: Successful ultrasound left hepatic metastasis 18 gauge core biopsy Electronically Signed   By: Jerilynn Mages.  Shick M.D.   On: 05/04/2018 15:00    ASSESSMENT: This is a very pleasant 77 years old white female recently diagnosed with extensive stage (T2b, N3, M1c) small cell lung cancer presented with right middle lobe lung mass in addition to right hilar and mediastinal lymphadenopathy as well as metastatic disease to the bone and liver diagnosed in February 2020.    PLAN: I had a lengthy discussion with the patient and her husband today about her current disease stage, prognosis and treatment options. I personally and independently reviewed the scan images and showed the images to the patient and her husband today. I recommended for the patient to complete the staging work-up by ordering MRI of the brain to rule out brain metastasis. I explained to the patient that she has incurable condition and all the treatment will be of palliative nature. I gave the patient  the option of palliative care and hospice referral versus palliative systemic chemotherapy with carboplatin for AUC of 5 on day 1, etoposide 100 mg/M2 on days 1, 2 and 3 with Tecentriq 1200 mg IV every 3 weeks with Neulasta support.  I discussed with the patient the adverse effect of this treatment including but not limited to alopecia, myelosuppression, nausea and vomiting, peripheral neuropathy, liver or renal dysfunction as well as immunotherapy mediated adverse effect including skin rash, diarrhea, inflammation of the lung, kidney, liver, thyroid or other endocrine dysfunction including type 1 diabetes. The patient and her husband are interested in proceeding  with systemic chemotherapy.  I may start with a reduced dose of carboplatin initially for the first cycle with AUC of 4 and also etoposide 80 mg/M2 because of her poor general status before increasing her dose with the following cycles. I will arrange for the patient to have a chemotherapy education class before the first dose of her treatment. I sent the prescription for Compazine 10 mg p.o. every 6 hours as needed for nausea to her pharmacy. The patient is expected to start the first cycle of her treatment on May 17, 2018.  She will come back for follow-up visit 1 week after her treatment for evaluation and management of any adverse effect of her treatment. The patient was advised to call immediately if she has any concerning symptoms in the interval.  The patient voices understanding of current disease status and treatment options and is in agreement with the current care plan.  All questions were answered. The patient knows to call the clinic with any problems, questions or concerns. We can certainly see the patient much sooner if necessary.  Thank you so much for allowing me to participate in the care of [name]. I will continue to follow up the patient with you and assist in her care.  I spent 55 minutes counseling the patient face to face. The total time spent in the appointment was 80 minutes.  Disclaimer: This note was dictated with voice recognition software. Similar sounding words can inadvertently be transcribed and may not be corrected upon review.   Eilleen Kempf May 07, 2018, 8:44 AM

## 2018-05-07 NOTE — Progress Notes (Signed)
START ON PATHWAY REGIMEN - Small Cell Lung     Cycles 1 through 4, every 21 days:     Atezolizumab      Carboplatin      Etoposide    Cycles 5 and beyond, every 21 days:     Atezolizumab   **Always confirm dose/schedule in your pharmacy ordering system**  Patient Characteristics: Newly Diagnosed, Preoperative or Nonsurgical Candidate (Clinical Staging), First Line, Extensive Stage Therapeutic Status: Newly Diagnosed, Preoperative or Nonsurgical Candidate (Clinical Staging) AJCC T Category: cT2b AJCC N Category: cN3 AJCC M Category: cM1c AJCC 8 Stage Grouping: IVB Stage Classification: Extensive Intent of Therapy: Non-Curative / Palliative Intent, Discussed with Patient

## 2018-05-07 NOTE — Telephone Encounter (Signed)
Gave avs and calendar ° °

## 2018-05-08 ENCOUNTER — Encounter: Payer: Self-pay | Admitting: Internal Medicine

## 2018-05-08 DIAGNOSIS — Z5112 Encounter for antineoplastic immunotherapy: Secondary | ICD-10-CM | POA: Insufficient documentation

## 2018-05-08 DIAGNOSIS — Z7189 Other specified counseling: Secondary | ICD-10-CM | POA: Insufficient documentation

## 2018-05-08 DIAGNOSIS — Z5111 Encounter for antineoplastic chemotherapy: Secondary | ICD-10-CM | POA: Insufficient documentation

## 2018-05-08 MED ORDER — PROCHLORPERAZINE MALEATE 10 MG PO TABS: 10.0000 mg | ORAL_TABLET | Freq: Four times a day (QID) | ORAL | 0 refills | Status: AC | PRN

## 2018-05-10 ENCOUNTER — Other Ambulatory Visit: Payer: Self-pay | Admitting: Medical Oncology

## 2018-05-10 ENCOUNTER — Inpatient Hospital Stay: Payer: Medicare Other

## 2018-05-10 ENCOUNTER — Encounter: Payer: Self-pay | Admitting: *Deleted

## 2018-05-10 DIAGNOSIS — I878 Other specified disorders of veins: Secondary | ICD-10-CM

## 2018-05-10 MED ORDER — LIDOCAINE-PRILOCAINE 2.5-2.5 % EX CREA: 1.0000 "application " | TOPICAL_CREAM | CUTANEOUS | 0 refills | Status: AC | PRN

## 2018-05-10 NOTE — Progress Notes (Signed)
Oncology Nurse Navigator Documentation  Oncology Nurse Navigator Flowsheets 05/10/2018  Navigator Location [facility]  Navigator Encounter Type Other/I followed up on [name] MRI scan.  This has not been authorized yet so I reached out to managed care dept to help expedite authorization.    Treatment Phase Pre-Tx/Tx Discussion  Barriers/Navigation Needs Coordination of Care  Interventions Coordination of Care  Coordination of Care Other  Acuity Level 1  Time Spent with Patient 15

## 2018-05-12 ENCOUNTER — Telehealth: Payer: Self-pay | Admitting: Medical Oncology

## 2018-05-12 ENCOUNTER — Telehealth: Payer: Self-pay | Admitting: Internal Medicine

## 2018-05-12 NOTE — Telephone Encounter (Signed)
Called patient to inform of addon approval for 3/16 infusion. Spoke with patient. Confirmed time of appt.

## 2018-05-12 NOTE — Telephone Encounter (Signed)
EMLA cream auth for one year starting today.

## 2018-05-17 ENCOUNTER — Other Ambulatory Visit: Payer: Self-pay | Admitting: Internal Medicine

## 2018-05-17 ENCOUNTER — Other Ambulatory Visit: Payer: Self-pay

## 2018-05-17 ENCOUNTER — Ambulatory Visit (HOSPITAL_BASED_OUTPATIENT_CLINIC_OR_DEPARTMENT_OTHER): Payer: Medicare Other | Admitting: Medical

## 2018-05-17 ENCOUNTER — Inpatient Hospital Stay: Payer: Medicare Other

## 2018-05-17 VITALS — BP 167/91 | HR 85 | Temp 98.5°F | Resp 19

## 2018-05-17 DIAGNOSIS — C3491 Malignant neoplasm of unspecified part of right bronchus or lung: Secondary | ICD-10-CM

## 2018-05-17 DIAGNOSIS — C342 Malignant neoplasm of middle lobe, bronchus or lung: Secondary | ICD-10-CM

## 2018-05-17 DIAGNOSIS — L539 Erythematous condition, unspecified: Secondary | ICD-10-CM

## 2018-05-17 DIAGNOSIS — T80818A Extravasation of other vesicant agent, initial encounter: Secondary | ICD-10-CM

## 2018-05-17 DIAGNOSIS — C7951 Secondary malignant neoplasm of bone: Secondary | ICD-10-CM

## 2018-05-17 DIAGNOSIS — C787 Secondary malignant neoplasm of liver and intrahepatic bile duct: Secondary | ICD-10-CM

## 2018-05-17 DIAGNOSIS — R2232 Localized swelling, mass and lump, left upper limb: Secondary | ICD-10-CM

## 2018-05-17 DIAGNOSIS — Z5189 Encounter for other specified aftercare: Secondary | ICD-10-CM | POA: Diagnosis not present

## 2018-05-17 LAB — CBC WITH DIFFERENTIAL (CANCER CENTER ONLY)
Abs Immature Granulocytes: 0.15 K/uL — ABNORMAL HIGH (ref 0.00–0.07)
Basophils Absolute: 0.2 K/uL — ABNORMAL HIGH (ref 0.0–0.1)
Basophils Relative: 1 %
Eosinophils Absolute: 0.5 K/uL (ref 0.0–0.5)
Eosinophils Relative: 3 %
HCT: 36.1 % (ref 36.0–46.0)
Hemoglobin: 11.4 g/dL — ABNORMAL LOW (ref 12.0–15.0)
Immature Granulocytes: 1 %
Lymphocytes Relative: 8 %
Lymphs Abs: 1.1 K/uL (ref 0.7–4.0)
MCH: 30.6 pg (ref 26.0–34.0)
MCHC: 31.6 g/dL (ref 30.0–36.0)
MCV: 97 fL (ref 80.0–100.0)
Monocytes Absolute: 1 K/uL (ref 0.1–1.0)
Monocytes Relative: 7 %
Neutro Abs: 11.3 K/uL — ABNORMAL HIGH (ref 1.7–7.7)
Neutrophils Relative %: 80 %
Platelet Count: 338 K/uL (ref 150–400)
RBC: 3.72 MIL/uL — ABNORMAL LOW (ref 3.87–5.11)
RDW: 14.4 % (ref 11.5–15.5)
WBC Count: 14.2 K/uL — ABNORMAL HIGH (ref 4.0–10.5)
nRBC: 0 % (ref 0.0–0.2)

## 2018-05-17 LAB — CMP (CANCER CENTER ONLY)
ALT: 32 U/L (ref 0–44)
AST: 37 U/L (ref 15–41)
Albumin: 3.1 g/dL — ABNORMAL LOW (ref 3.5–5.0)
Alkaline Phosphatase: 141 U/L — ABNORMAL HIGH (ref 38–126)
Anion gap: 15 (ref 5–15)
BUN: 39 mg/dL — ABNORMAL HIGH (ref 8–23)
CO2: 30 mmol/L (ref 22–32)
Calcium: 9 mg/dL (ref 8.9–10.3)
Chloride: 97 mmol/L — ABNORMAL LOW (ref 98–111)
Creatinine: 1.19 mg/dL — ABNORMAL HIGH (ref 0.44–1.00)
GFR, Est AFR Am: 51 mL/min — ABNORMAL LOW
GFR, Estimated: 44 mL/min — ABNORMAL LOW
Glucose, Bld: 114 mg/dL — ABNORMAL HIGH (ref 70–99)
Potassium: 3.4 mmol/L — ABNORMAL LOW (ref 3.5–5.1)
Sodium: 142 mmol/L (ref 135–145)
Total Bilirubin: 0.3 mg/dL (ref 0.3–1.2)
Total Protein: 6.1 g/dL — ABNORMAL LOW (ref 6.5–8.1)

## 2018-05-17 LAB — TSH: TSH: 0.121 u[IU]/mL — ABNORMAL LOW (ref 0.308–3.960)

## 2018-05-17 MED ORDER — SODIUM CHLORIDE 0.9 % IV SOLN
80.0000 mg/m2 | Freq: Once | INTRAVENOUS | Status: AC
  Administered 2018-05-17: 140 mg via INTRAVENOUS
  Filled 2018-05-17: qty 7

## 2018-05-17 MED ORDER — SODIUM CHLORIDE 0.9 % IV SOLN
Freq: Once | INTRAVENOUS | Status: AC
  Administered 2018-05-17: 09:00:00 via INTRAVENOUS
  Filled 2018-05-17: qty 5

## 2018-05-17 MED ORDER — SODIUM CHLORIDE 0.9 % IV SOLN
Freq: Once | INTRAVENOUS | Status: AC
  Administered 2018-05-17: 09:00:00 via INTRAVENOUS
  Filled 2018-05-17: qty 250

## 2018-05-17 MED ORDER — PALONOSETRON HCL INJECTION 0.25 MG/5ML
0.2500 mg | Freq: Once | INTRAVENOUS | Status: AC
  Administered 2018-05-17: 0.25 mg via INTRAVENOUS

## 2018-05-17 MED ORDER — SODIUM CHLORIDE 0.9 % IV SOLN
260.0000 mg | Freq: Once | INTRAVENOUS | Status: AC
  Administered 2018-05-17: 260 mg via INTRAVENOUS
  Filled 2018-05-17: qty 26

## 2018-05-17 MED ORDER — PALONOSETRON HCL INJECTION 0.25 MG/5ML
INTRAVENOUS | Status: AC
  Filled 2018-05-17: qty 5

## 2018-05-17 MED ORDER — SODIUM CHLORIDE 0.9 % IV SOLN
1200.0000 mg | Freq: Once | INTRAVENOUS | Status: AC
  Administered 2018-05-17: 1200 mg via INTRAVENOUS
  Filled 2018-05-17: qty 20

## 2018-05-17 MED ORDER — HOT PACK MISC ONCOLOGY
1.0000 | Freq: Once | Status: DC | PRN
  Filled 2018-05-17: qty 1

## 2018-05-17 MED ORDER — OXYCODONE-ACETAMINOPHEN 5-325 MG PO TABS: 1.0000 | ORAL_TABLET | Freq: Once | ORAL | Status: DC

## 2018-05-17 NOTE — Progress Notes (Signed)
Patient c/o left hip pain. Received verbal order for percocet 1 tab during treatment. On review of patient allergies patient stated that she is not sure if she has taken hydrocodone in the past. Her an her husband stated that she took pain medication in the past that caused SOB and her O2 sats to drop. Patient and spouse stated that they have records at home as to what that medication is. Explained to bring in the documentation and provide to Dr. Julien Nordmann nurse to have on hand for patient records. Explained that until verified will not receive percocet today. Patient and spouse verbalized understanding.  AT 12:45pm this RN noticed redness around the patient IV, able to obtain blood return in the hub and patient denied pain. Etoposide stopped due to possible infiltration. Etoposide stopped at 12:45 and Sandi Mealy PA called to assess site. Heat applied to site per protocol. Extravasation teaching instruction sheet provided to patient and reviewed. New PIV started by Amy RN and etoposide started at 12:50pm.

## 2018-05-17 NOTE — Patient Instructions (Signed)
[place] Discharge Instructions for Patients Receiving Chemotherapy  Today you received the following chemotherapy agents: Tecentriq, Carboplatin. Etoposide  To help prevent nausea and vomiting after your treatment, we encourage you to take your nausea medication as directed.   If you develop nausea and vomiting that is not controlled by your nausea medication, call the clinic.   BELOW ARE SYMPTOMS THAT SHOULD BE REPORTED IMMEDIATELY:  *FEVER GREATER THAN 100.5 F  *CHILLS WITH OR WITHOUT FEVER  NAUSEA AND VOMITING THAT IS NOT CONTROLLED WITH YOUR NAUSEA MEDICATION  *UNUSUAL SHORTNESS OF BREATH  *UNUSUAL BRUISING OR BLEEDING  TENDERNESS IN MOUTH AND THROAT WITH OR WITHOUT PRESENCE OF ULCERS  *URINARY PROBLEMS  *BOWEL PROBLEMS  UNUSUAL RASH Items with * indicate a potential emergency and should be followed up as soon as possible.  Feel free to call the clinic should you have any questions or concerns. The clinic phone number is [phone].  Please show the [place] at check-in to the Emergency Department and triage nurse.  Atezolizumab injection What is this medicine? ATEZOLIZUMAB (a te zoe LIZ ue mab) is a monoclonal antibody. It is used to treat bladder cancer (urothelial cancer), non-small cell lung cancer, small cell lung cancer, and breast cancer. This medicine may be used for other purposes; ask your health care provider or pharmacist if you have questions. COMMON BRAND NAME(S): Tecentriq What should I tell my health care provider before I take this medicine? They need to know if you have any of these conditions: -diabetes -immune system problems -infection -inflammatory bowel disease -liver disease -lung or breathing disease -lupus -nervous system problems like myasthenia gravis or Guillain-Barre syndrome -organ transplant -an unusual or allergic reaction to atezolizumab, other medicines, foods, dyes, or preservatives -pregnant or  trying to get pregnant -breast-feeding How should I use this medicine? This medicine is for infusion into a vein. It is given by a health care professional in a hospital or clinic setting. A special MedGuide will be given to you before each treatment. Be sure to read this information carefully each time. Talk to your pediatrician regarding the use of this medicine in children. Special care may be needed. Overdosage: If you think you have taken too much of this medicine contact a poison control center or emergency room at once. NOTE: This medicine is only for you. Do not share this medicine with others. What if I miss a dose? It is important not to miss your dose. Call your doctor or health care professional if you are unable to keep an appointment. What may interact with this medicine? Interactions have not been studied. This list may not describe all possible interactions. Give your health care provider a list of all the medicines, herbs, non-prescription drugs, or dietary supplements you use. Also tell them if you smoke, drink alcohol, or use illegal drugs. Some items may interact with your medicine. What should I watch for while using this medicine? Your condition will be monitored carefully while you are receiving this medicine. You may need blood work done while you are taking this medicine. Do not become pregnant while taking this medicine or for at least 5 months after stopping it. Women should inform their doctor if they wish to become pregnant or think they might be pregnant. There is a potential for serious side effects to an unborn child. Talk to your health care professional or pharmacist for more information. Do not breast-feed an infant while taking this medicine or for at least  5 months after the last dose. What side effects may I notice from receiving this medicine? Side effects that you should report to your doctor or health care professional as soon as possible: -allergic  reactions like skin rash, itching or hives, swelling of the face, lips, or tongue -black, tarry stools -bloody or watery diarrhea -breathing problems -changes in vision -chest pain or chest tightness -chills -facial flushing -fever -headache -signs and symptoms of high blood sugar such as dizziness; dry mouth; dry skin; fruity breath; nausea; stomach pain; increased hunger or thirst; increased urination -signs and symptoms of liver injury like dark yellow or brown urine; general ill feeling or flu-like symptoms; light-colored stools; loss of appetite; nausea; right upper belly pain; unusually weak or tired; yellowing of the eyes or skin -stomach pain -trouble passing urine or change in the amount of urine Side effects that usually do not require medical attention (report to your doctor or health care professional if they continue or are bothersome): -cough -diarrhea -joint pain -muscle pain -muscle weakness -tiredness -weight loss This list may not describe all possible side effects. Call your doctor for medical advice about side effects. You may report side effects to FDA at 1-800-FDA-1088. Where should I keep my medicine? This drug is given in a hospital or clinic and will not be stored at home. NOTE: This sheet is a summary. It may not cover all possible information. If you have questions about this medicine, talk to your doctor, pharmacist, or health care provider.  2019 Elsevier/Gold Standard (2017-05-22 09:33:38)  Carboplatin injection What is this medicine? CARBOPLATIN (KAR boe pla tin) is a chemotherapy drug. It targets fast dividing cells, like cancer cells, and causes these cells to die. This medicine is used to treat ovarian cancer and many other cancers. This medicine may be used for other purposes; ask your health care provider or pharmacist if you have questions. COMMON BRAND NAME(S): Paraplatin What should I tell my health care provider before I take this  medicine? They need to know if you have any of these conditions: -blood disorders -hearing problems -kidney disease -recent or ongoing radiation therapy -an unusual or allergic reaction to carboplatin, cisplatin, other chemotherapy, other medicines, foods, dyes, or preservatives -pregnant or trying to get pregnant -breast-feeding How should I use this medicine? This drug is usually given as an infusion into a vein. It is administered in a hospital or clinic by a specially trained health care professional. Talk to your pediatrician regarding the use of this medicine in children. Special care may be needed. Overdosage: If you think you have taken too much of this medicine contact a poison control center or emergency room at once. NOTE: This medicine is only for you. Do not share this medicine with others. What if I miss a dose? It is important not to miss a dose. Call your doctor or health care professional if you are unable to keep an appointment. What may interact with this medicine? -medicines for seizures -medicines to increase blood counts like filgrastim, pegfilgrastim, sargramostim -some antibiotics like amikacin, gentamicin, neomycin, streptomycin, tobramycin -vaccines Talk to your doctor or health care professional before taking any of these medicines: -acetaminophen -aspirin -ibuprofen -ketoprofen -naproxen This list may not describe all possible interactions. Give your health care provider a list of all the medicines, herbs, non-prescription drugs, or dietary supplements you use. Also tell them if you smoke, drink alcohol, or use illegal drugs. Some items may interact with your medicine. What should I watch for while  using this medicine? Your condition will be monitored carefully while you are receiving this medicine. You will need important blood work done while you are taking this medicine. This drug may make you feel generally unwell. This is not uncommon, as chemotherapy  can affect healthy cells as well as cancer cells. Report any side effects. Continue your course of treatment even though you feel ill unless your doctor tells you to stop. In some cases, you may be given additional medicines to help with side effects. Follow all directions for their use. Call your doctor or health care professional for advice if you get a fever, chills or sore throat, or other symptoms of a cold or flu. Do not treat yourself. This drug decreases your body's ability to fight infections. Try to avoid being around people who are sick. This medicine may increase your risk to bruise or bleed. Call your doctor or health care professional if you notice any unusual bleeding. Be careful brushing and flossing your teeth or using a toothpick because you may get an infection or bleed more easily. If you have any dental work done, tell your dentist you are receiving this medicine. Avoid taking products that contain aspirin, acetaminophen, ibuprofen, naproxen, or ketoprofen unless instructed by your doctor. These medicines may hide a fever. Do not become pregnant while taking this medicine. Women should inform their doctor if they wish to become pregnant or think they might be pregnant. There is a potential for serious side effects to an unborn child. Talk to your health care professional or pharmacist for more information. Do not breast-feed an infant while taking this medicine. What side effects may I notice from receiving this medicine? Side effects that you should report to your doctor or health care professional as soon as possible: -allergic reactions like skin rash, itching or hives, swelling of the face, lips, or tongue -signs of infection - fever or chills, cough, sore throat, pain or difficulty passing urine -signs of decreased platelets or bleeding - bruising, pinpoint red spots on the skin, black, tarry stools, nosebleeds -signs of decreased red blood cells - unusually weak or tired,  fainting spells, lightheadedness -breathing problems -changes in hearing -changes in vision -chest pain -high blood pressure -low blood counts - This drug may decrease the number of white blood cells, red blood cells and platelets. You may be at increased risk for infections and bleeding. -nausea and vomiting -pain, swelling, redness or irritation at the injection site -pain, tingling, numbness in the hands or feet -problems with balance, talking, walking -trouble passing urine or change in the amount of urine Side effects that usually do not require medical attention (report to your doctor or health care professional if they continue or are bothersome): -hair loss -loss of appetite -metallic taste in the mouth or changes in taste This list may not describe all possible side effects. Call your doctor for medical advice about side effects. You may report side effects to FDA at 1-800-FDA-1088. Where should I keep my medicine? This drug is given in a hospital or clinic and will not be stored at home. NOTE: This sheet is a summary. It may not cover all possible information. If you have questions about this medicine, talk to your doctor, pharmacist, or health care provider.  2019 Elsevier/Gold Standard (2007-05-25 14:38:05)  Etoposide, VP-16 injection What is this medicine? ETOPOSIDE, VP-16 (e toe POE side) is a chemotherapy drug. It is used to treat testicular cancer, lung cancer, and other cancers. This medicine may  be used for other purposes; ask your health care provider or pharmacist if you have questions. COMMON BRAND NAME(S): Etopophos, Toposar, VePesid What should I tell my health care provider before I take this medicine? They need to know if you have any of these conditions: -infection -kidney disease -liver disease -low blood counts, like low white cell, platelet, or red cell counts -an unusual or allergic reaction to etoposide, other medicines, foods, dyes, or  preservatives -pregnant or trying to get pregnant -breast-feeding How should I use this medicine? This medicine is for infusion into a vein. It is administered in a hospital or clinic by a specially trained health care professional. Talk to your pediatrician regarding the use of this medicine in children. Special care may be needed. Overdosage: If you think you have taken too much of this medicine contact a poison control center or emergency room at once. NOTE: This medicine is only for you. Do not share this medicine with others. What if I miss a dose? It is important not to miss your dose. Call your doctor or health care professional if you are unable to keep an appointment. What may interact with this medicine? -aspirin -certain medications for seizures like carbamazepine, phenobarbital, phenytoin, valproic acid -cyclosporine -levamisole -warfarin This list may not describe all possible interactions. Give your health care provider a list of all the medicines, herbs, non-prescription drugs, or dietary supplements you use. Also tell them if you smoke, drink alcohol, or use illegal drugs. Some items may interact with your medicine. What should I watch for while using this medicine? Visit your doctor for checks on your progress. This drug may make you feel generally unwell. This is not uncommon, as chemotherapy can affect healthy cells as well as cancer cells. Report any side effects. Continue your course of treatment even though you feel ill unless your doctor tells you to stop. In some cases, you may be given additional medicines to help with side effects. Follow all directions for their use. Call your doctor or health care professional for advice if you get a fever, chills or sore throat, or other symptoms of a cold or flu. Do not treat yourself. This drug decreases your body's ability to fight infections. Try to avoid being around people who are sick. This medicine may increase your risk to  bruise or bleed. Call your doctor or health care professional if you notice any unusual bleeding. Talk to your doctor about your risk of cancer. You may be more at risk for certain types of cancers if you take this medicine. Do not become pregnant while taking this medicine or for at least 6 months after stopping it. Women should inform their doctor if they wish to become pregnant or think they might be pregnant. Women of child-bearing potential will need to have a negative pregnancy test before starting this medicine. There is a potential for serious side effects to an unborn child. Talk to your health care professional or pharmacist for more information. Do not breast-feed an infant while taking this medicine. Men must use a latex condom during sexual contact with a woman while taking this medicine and for at least 4 months after stopping it. A latex condom is needed even if you have had a vasectomy. Contact your doctor right away if your partner becomes pregnant. Do not donate sperm while taking this medicine and for at least 4 months after you stop taking this medicine. Men should inform their doctors if they wish to father a child.  This medicine may lower sperm counts. What side effects may I notice from receiving this medicine? Side effects that you should report to your doctor or health care professional as soon as possible: -allergic reactions like skin rash, itching or hives, swelling of the face, lips, or tongue -low blood counts - this medicine may decrease the number of white blood cells, red blood cells and platelets. You may be at increased risk for infections and bleeding. -signs of infection - fever or chills, cough, sore throat, pain or difficulty passing urine -signs of decreased platelets or bleeding - bruising, pinpoint red spots on the skin, black, tarry stools, blood in the urine -signs of decreased red blood cells - unusually weak or tired, fainting spells,  lightheadedness -breathing problems -changes in vision -mouth or throat sores or ulcers -pain, redness, swelling or irritation at the injection site -pain, tingling, numbness in the hands or feet -redness, blistering, peeling or loosening of the skin, including inside the mouth -seizures -vomiting Side effects that usually do not require medical attention (report to your doctor or health care professional if they continue or are bothersome): -diarrhea -hair loss -loss of appetite -nausea -stomach pain This list may not describe all possible side effects. Call your doctor for medical advice about side effects. You may report side effects to FDA at 1-800-FDA-1088. Where should I keep my medicine? This drug is given in a hospital or clinic and will not be stored at home. NOTE: This sheet is a summary. It may not cover all possible information. If you have questions about this medicine, talk to your doctor, pharmacist, or health care provider.  2019 Elsevier/Gold Standard (2015-02-09 11:53:23)

## 2018-05-17 NOTE — Progress Notes (Signed)
Decrease carbplatin to auc = 4 and etoposide 80mg /m2 per dr Julien Nordmann

## 2018-05-18 ENCOUNTER — Other Ambulatory Visit: Payer: Self-pay | Admitting: Medical

## 2018-05-18 ENCOUNTER — Other Ambulatory Visit: Payer: Self-pay

## 2018-05-18 ENCOUNTER — Other Ambulatory Visit: Payer: Self-pay | Admitting: Pharmacist

## 2018-05-18 ENCOUNTER — Inpatient Hospital Stay (HOSPITAL_BASED_OUTPATIENT_CLINIC_OR_DEPARTMENT_OTHER): Payer: Medicare Other | Admitting: Medical

## 2018-05-18 ENCOUNTER — Inpatient Hospital Stay: Payer: Medicare Other

## 2018-05-18 VITALS — BP 154/77 | HR 81 | Temp 98.1°F | Resp 17 | Ht 64.0 in

## 2018-05-18 DIAGNOSIS — Z5189 Encounter for other specified aftercare: Secondary | ICD-10-CM | POA: Diagnosis not present

## 2018-05-18 DIAGNOSIS — F419 Anxiety disorder, unspecified: Secondary | ICD-10-CM

## 2018-05-18 DIAGNOSIS — T80818D Extravasation of other vesicant agent, subsequent encounter: Secondary | ICD-10-CM

## 2018-05-18 DIAGNOSIS — C3491 Malignant neoplasm of unspecified part of right bronchus or lung: Secondary | ICD-10-CM

## 2018-05-18 MED ORDER — SODIUM CHLORIDE 0.9 % IV SOLN
Freq: Once | INTRAVENOUS | Status: AC
  Administered 2018-05-18: 14:00:00 via INTRAVENOUS
  Filled 2018-05-18: qty 250

## 2018-05-18 MED ORDER — DEXAMETHASONE SODIUM PHOSPHATE 10 MG/ML IJ SOLN
10.0000 mg | Freq: Once | INTRAMUSCULAR | Status: AC
  Administered 2018-05-18: 10 mg via INTRAVENOUS

## 2018-05-18 MED ORDER — DEXAMETHASONE SODIUM PHOSPHATE 10 MG/ML IJ SOLN
INTRAMUSCULAR | Status: AC
  Filled 2018-05-18: qty 1

## 2018-05-18 MED ORDER — LORAZEPAM 1 MG PO TABS
0.5000 mg | ORAL_TABLET | Freq: Once | ORAL | Status: AC
  Administered 2018-05-18: 0.5 mg via ORAL

## 2018-05-18 MED ORDER — SODIUM CHLORIDE 0.9 % IV SOLN: 10.0000 mg | Freq: Once | INTRAVENOUS | Status: DC

## 2018-05-18 MED ORDER — LORAZEPAM 1 MG PO TABS
ORAL_TABLET | ORAL | Status: AC
  Filled 2018-05-18: qty 1

## 2018-05-18 MED ORDER — LORAZEPAM 0.5 MG PO TABS: 0.5000 mg | ORAL_TABLET | Freq: Two times a day (BID) | ORAL | 0 refills | Status: DC | PRN

## 2018-05-18 MED ORDER — SODIUM CHLORIDE 0.9 % IV SOLN
80.0000 mg/m2 | Freq: Once | INTRAVENOUS | Status: AC
  Administered 2018-05-18: 140 mg via INTRAVENOUS
  Filled 2018-05-18: qty 7

## 2018-05-18 NOTE — Patient Instructions (Signed)
[facility] Discharge Instructions for Patients Receiving Chemotherapy  Today you received the following chemotherapy agents Etoposide.  To help prevent nausea and vomiting after your treatment, we encourage you to take your nausea medication as directed.  If you develop nausea and vomiting that is not controlled by your nausea medication, call the clinic.   BELOW ARE SYMPTOMS THAT SHOULD BE REPORTED IMMEDIATELY:  *FEVER GREATER THAN 100.5 F  *CHILLS WITH OR WITHOUT FEVER  NAUSEA AND VOMITING THAT IS NOT CONTROLLED WITH YOUR NAUSEA MEDICATION  *UNUSUAL SHORTNESS OF BREATH  *UNUSUAL BRUISING OR BLEEDING  TENDERNESS IN MOUTH AND THROAT WITH OR WITHOUT PRESENCE OF ULCERS  *URINARY PROBLEMS  *BOWEL PROBLEMS  UNUSUAL RASH Items with * indicate a potential emergency and should be followed up as soon as possible.  Feel free to call the clinic should you have any questions or concerns. The clinic phone number is [phone].  Please show the [place] at check-in to the Emergency Department and triage nurse.

## 2018-05-19 ENCOUNTER — Ambulatory Visit (HOSPITAL_COMMUNITY)
Admission: RE | Admit: 2018-05-19 | Discharge: 2018-05-19 | Disposition: A | Discharge status: Home / Self Care | Payer: Medicare Other | Source: Ambulatory Visit | Attending: Internal Medicine | Admitting: Internal Medicine

## 2018-05-19 ENCOUNTER — Inpatient Hospital Stay (HOSPITAL_BASED_OUTPATIENT_CLINIC_OR_DEPARTMENT_OTHER): Payer: Medicare Other | Admitting: Medical

## 2018-05-19 ENCOUNTER — Inpatient Hospital Stay: Payer: Medicare Other

## 2018-05-19 ENCOUNTER — Encounter: Payer: Self-pay | Admitting: *Deleted

## 2018-05-19 VITALS — BP 159/99 | HR 94 | Temp 98.2°F | Resp 18

## 2018-05-19 DIAGNOSIS — C3491 Malignant neoplasm of unspecified part of right bronchus or lung: Secondary | ICD-10-CM | POA: Diagnosis not present

## 2018-05-19 DIAGNOSIS — T80818D Extravasation of other vesicant agent, subsequent encounter: Secondary | ICD-10-CM

## 2018-05-19 DIAGNOSIS — Z5189 Encounter for other specified aftercare: Secondary | ICD-10-CM | POA: Diagnosis not present

## 2018-05-19 MED ORDER — DEXAMETHASONE SODIUM PHOSPHATE 10 MG/ML IJ SOLN
10.0000 mg | Freq: Once | INTRAMUSCULAR | Status: AC
  Administered 2018-05-19: 10 mg via INTRAVENOUS

## 2018-05-19 MED ORDER — ACETAMINOPHEN 325 MG PO TABS
650.0000 mg | ORAL_TABLET | Freq: Once | ORAL | Status: AC
  Administered 2018-05-19: 650 mg via ORAL

## 2018-05-19 MED ORDER — DEXAMETHASONE SODIUM PHOSPHATE 10 MG/ML IJ SOLN
INTRAMUSCULAR | Status: AC
  Filled 2018-05-19: qty 1

## 2018-05-19 MED ORDER — SODIUM CHLORIDE 0.9 % IV SOLN
Freq: Once | INTRAVENOUS | Status: AC
  Administered 2018-05-19: 15:00:00 via INTRAVENOUS
  Filled 2018-05-19: qty 250

## 2018-05-19 MED ORDER — LORAZEPAM 1 MG PO TABS
ORAL_TABLET | ORAL | Status: AC
  Filled 2018-05-19: qty 1

## 2018-05-19 MED ORDER — SODIUM CHLORIDE 0.9 % IV SOLN
80.0000 mg/m2 | Freq: Once | INTRAVENOUS | Status: AC
  Administered 2018-05-19: 140 mg via INTRAVENOUS
  Filled 2018-05-19: qty 7

## 2018-05-19 MED ORDER — LORAZEPAM 1 MG PO TABS
0.5000 mg | ORAL_TABLET | Freq: Once | ORAL | Status: AC
  Administered 2018-05-19: 0.5 mg via ORAL

## 2018-05-19 MED ORDER — GADOBUTROL 1 MMOL/ML IV SOLN
7.0000 mL | Freq: Once | INTRAVENOUS | Status: AC | PRN
  Administered 2018-05-19: 6 mL via INTRAVENOUS

## 2018-05-19 MED ORDER — ACETAMINOPHEN 325 MG PO TABS
ORAL_TABLET | ORAL | Status: AC
  Filled 2018-05-19: qty 2

## 2018-05-19 NOTE — Progress Notes (Signed)
   DATE: 05/17/2018      IV EXTRAVASATION (IRRITANT)  MD: Dr. Fanny Bien. Mohamed  AGENT RECEIVED AT TIME OF EXTRAVASATION:   Etoposide  IV SITE LOCATION: Left anterior forearm  INTERVENTION:  1) IV stopped  2) 0 ml liquid/blood aspirated from IV site.  3) Patient Teaching Instruction Sheet reviewed with [name].  A) Apply heat to area for 15 to 20 minutes 4 times daily for the next 48 hours B) Elevate the affected site for the next 48 hours. C) Coban dressing applied to area. Patient instructed to protect the area from sunlight. D) Return for evaluation in 24 hours, 48 hours, and 7 days. E) [name] was instructed to call [phone] if she has questions or notes acute changes to the area.  Review of Systems  Constitutional: Negative for diaphoresis.  HENT: Negative for trouble swallowing.   Respiratory: Negative for chest tightness and shortness of breath.   Cardiovascular: Negative for chest pain.  Skin: Negative for rash and wound.       Erythema and swelling of the left medial forearm.    Physical Exam Constitutional:      General: She is not in acute distress.    Appearance: She is not diaphoretic.  HENT:     Head: Normocephalic and atraumatic.  Cardiovascular:     Rate and Rhythm: Normal rate and regular rhythm.     Heart sounds: Normal heart sounds. No murmur. No friction rub. No gallop.   Pulmonary:     Effort: Pulmonary effort is normal. No respiratory distress.     Breath sounds: Normal breath sounds. No wheezing or rales.  Skin:    General: Skin is warm and dry.     Findings: Erythema present. No rash.     Comments: Erythema and swelling of the left medial forearm.   Neurological:     Mental Status: She is alert.     Sandi Mealy, MHS, PA-C

## 2018-05-19 NOTE — Progress Notes (Signed)
The patient was seen today in follow-up of an extravasation of etoposide yesterday in her left dorsal forearm.  The area shows minimal bruising with no evidence of vesicles, skin breakdown, or exudate.  She will return tomorrow to be rechecked again and then on 05/25/2018.  Sandi Mealy MHS, PA-C Physician Assistant

## 2018-05-19 NOTE — Progress Notes (Signed)
Patient left forearm reassessed post etoposide infiltration. Site improving, assessed by Sandi Mealy PA, and patient had no complaints.

## 2018-05-21 ENCOUNTER — Inpatient Hospital Stay (HOSPITAL_BASED_OUTPATIENT_CLINIC_OR_DEPARTMENT_OTHER): Payer: Medicare Other | Admitting: Medical

## 2018-05-21 ENCOUNTER — Other Ambulatory Visit: Payer: Self-pay

## 2018-05-21 ENCOUNTER — Inpatient Hospital Stay: Payer: Medicare Other

## 2018-05-21 VITALS — BP 138/76 | HR 35 | Temp 98.6°F | Resp 24

## 2018-05-21 VITALS — BP 114/74 | HR 120 | Temp 98.3°F | Resp 22

## 2018-05-21 DIAGNOSIS — R0602 Shortness of breath: Secondary | ICD-10-CM

## 2018-05-21 DIAGNOSIS — C787 Secondary malignant neoplasm of liver and intrahepatic bile duct: Secondary | ICD-10-CM

## 2018-05-21 DIAGNOSIS — R000 Tachycardia, unspecified: Secondary | ICD-10-CM

## 2018-05-21 DIAGNOSIS — C7951 Secondary malignant neoplasm of bone: Secondary | ICD-10-CM | POA: Diagnosis not present

## 2018-05-21 DIAGNOSIS — R0682 Tachypnea, not elsewhere classified: Secondary | ICD-10-CM

## 2018-05-21 DIAGNOSIS — C342 Malignant neoplasm of middle lobe, bronchus or lung: Secondary | ICD-10-CM | POA: Diagnosis not present

## 2018-05-21 DIAGNOSIS — I491 Atrial premature depolarization: Secondary | ICD-10-CM

## 2018-05-21 DIAGNOSIS — C3491 Malignant neoplasm of unspecified part of right bronchus or lung: Secondary | ICD-10-CM

## 2018-05-21 DIAGNOSIS — Z9981 Dependence on supplemental oxygen: Secondary | ICD-10-CM

## 2018-05-21 DIAGNOSIS — J449 Chronic obstructive pulmonary disease, unspecified: Secondary | ICD-10-CM

## 2018-05-21 DIAGNOSIS — Z5189 Encounter for other specified aftercare: Secondary | ICD-10-CM | POA: Diagnosis not present

## 2018-05-21 DIAGNOSIS — R5383 Other fatigue: Secondary | ICD-10-CM

## 2018-05-21 DIAGNOSIS — Z79899 Other long term (current) drug therapy: Secondary | ICD-10-CM

## 2018-05-21 DIAGNOSIS — Z87891 Personal history of nicotine dependence: Secondary | ICD-10-CM

## 2018-05-21 DIAGNOSIS — I499 Cardiac arrhythmia, unspecified: Secondary | ICD-10-CM

## 2018-05-21 LAB — CMP (CANCER CENTER ONLY)
ALT: 31 U/L (ref 0–44)
AST: 47 U/L — ABNORMAL HIGH (ref 15–41)
Albumin: 2.9 g/dL — ABNORMAL LOW (ref 3.5–5.0)
Alkaline Phosphatase: 117 U/L (ref 38–126)
Anion gap: 11 (ref 5–15)
BUN: 38 mg/dL — ABNORMAL HIGH (ref 8–23)
CO2: 29 mmol/L (ref 22–32)
Calcium: 8.4 mg/dL — ABNORMAL LOW (ref 8.9–10.3)
Chloride: 102 mmol/L (ref 98–111)
Creatinine: 0.88 mg/dL (ref 0.44–1.00)
GFR, Est AFR Am: >60 mL/min (ref >60)
GFR, Estimated: >60 mL/min (ref >60)
GLUCOSE: 88 mg/dL (ref 70–99)
Potassium: 4.3 mmol/L (ref 3.5–5.1)
Sodium: 142 mmol/L (ref 135–145)
Total Bilirubin: 0.5 mg/dL (ref 0.3–1.2)
Total Protein: 5.8 g/dL — ABNORMAL LOW (ref 6.5–8.1)

## 2018-05-21 LAB — CBC WITH DIFFERENTIAL (CANCER CENTER ONLY)
Abs Immature Granulocytes: 0.11 10*3/uL — ABNORMAL HIGH (ref 0.00–0.07)
Basophils Absolute: 0.1 10*3/uL (ref 0.0–0.1)
Basophils Relative: 1 %
Eosinophils Absolute: 0.4 10*3/uL (ref 0.0–0.5)
Eosinophils Relative: 3 %
HCT: 38.1 % (ref 36.0–46.0)
Hemoglobin: 11.9 g/dL — ABNORMAL LOW (ref 12.0–15.0)
Immature Granulocytes: 1 %
Lymphocytes Relative: 6 %
Lymphs Abs: 1 10*3/uL (ref 0.7–4.0)
MCH: 31.2 pg (ref 26.0–34.0)
MCHC: 31.2 g/dL (ref 30.0–36.0)
MCV: 99.7 fL (ref 80.0–100.0)
MONOS PCT: 1 %
Monocytes Absolute: 0.1 10*3/uL (ref 0.1–1.0)
NEUTROS PCT: 88 %
Neutro Abs: 13.7 10*3/uL — ABNORMAL HIGH (ref 1.7–7.7)
Platelet Count: 266 10*3/uL (ref 150–400)
RBC: 3.82 MIL/uL — ABNORMAL LOW (ref 3.87–5.11)
RDW: 14.6 % (ref 11.5–15.5)
WBC Count: 15.3 10*3/uL — ABNORMAL HIGH (ref 4.0–10.5)
nRBC: 0 % (ref 0.0–0.2)

## 2018-05-21 MED ORDER — PEGFILGRASTIM-CBQV 6 MG/0.6ML ~~LOC~~ SOSY
PREFILLED_SYRINGE | SUBCUTANEOUS | Status: AC
  Filled 2018-05-21: qty 0.6

## 2018-05-21 MED ORDER — ACETAMINOPHEN 325 MG PO TABS
650.0000 mg | ORAL_TABLET | Freq: Once | ORAL | Status: AC
  Administered 2018-05-21: 650 mg via ORAL

## 2018-05-21 MED ORDER — ACETAMINOPHEN 325 MG PO TABS
ORAL_TABLET | ORAL | Status: AC
  Filled 2018-05-21: qty 2

## 2018-05-21 MED ORDER — CLARITHROMYCIN 250 MG PO TABS: 250.0000 mg | ORAL_TABLET | Freq: Two times a day (BID) | ORAL | 0 refills | Status: DC

## 2018-05-21 MED ORDER — PEGFILGRASTIM-CBQV 6 MG/0.6ML ~~LOC~~ SOSY
6.0000 mg | PREFILLED_SYRINGE | Freq: Once | SUBCUTANEOUS | Status: AC
  Administered 2018-05-21: 6 mg via SUBCUTANEOUS

## 2018-05-21 MED ORDER — PREDNISONE 5 MG PO TABS: ORAL_TABLET | ORAL | 0 refills | Status: DC

## 2018-05-21 NOTE — Progress Notes (Signed)
Pt returned today post Chemo for Udenyca Injection. Pt stated that she did not feel well. Vital signs: BP114/74, Pulse 120, resp 22, Temp 98.3. Pt stated that she was so week that she could not cloth herself for her apt and had to get help. Lower legs were swollen. Communicated concerns to [employer] PA is symptoms management and arranged for her to be seen.

## 2018-05-21 NOTE — Progress Notes (Signed)
The patient was seen today at the 48-hour follow-up of her extravasation from 05/17/2018.  There is bruising along the left medial forearm which continues to improve.  She will return on 05/25/2018 for an 8-day follow-up.  Sandi Mealy, MHS, PA-C Physician Assistant

## 2018-05-21 NOTE — Patient Instructions (Signed)
Dehydration, Adult  Dehydration is when there is not enough fluid or water in your body. This happens when you lose more fluids than you take in. Dehydration can range from mild to very bad. It should be treated right away to keep it from getting very bad. Symptoms of mild dehydration may include:  Thirst.  Dry lips.  Slightly dry mouth.  Dry, warm skin.  Dizziness. Symptoms of moderate dehydration may include:  Very dry mouth.  Muscle cramps.  Dark pee (urine). Pee may be the color of tea.  Your body making less pee.  Your eyes making fewer tears.  Heartbeat that is uneven or faster than normal (palpitations).  Headache.  Light-headedness, especially when you stand up from sitting.  Fainting (syncope). Symptoms of very bad dehydration may include:  Changes in skin, such as: ? Cold and clammy skin. ? Blotchy (mottled) or pale skin. ? Skin that does not quickly return to normal after being lightly pinched and let go (poor skin turgor).  Changes in body fluids, such as: ? Feeling very thirsty. ? Your eyes making fewer tears. ? Not sweating when body temperature is high, such as in hot weather. ? Your body making very little pee.  Changes in vital signs, such as: ? Weak pulse. ? Pulse that is more than 100 beats a minute when you are sitting still. ? Fast breathing. ? Low blood pressure.  Other changes, such as: ? Sunken eyes. ? Cold hands and feet. ? Confusion. ? Lack of energy (lethargy). ? Trouble waking up from sleep. ? Short-term weight loss. ? Unconsciousness. Follow these instructions at home:   If told by your doctor, drink an ORS: ? Make an ORS by using instructions on the package. ? Start by drinking small amounts, about  cup (120 mL) every 5-10 minutes. ? Slowly drink more until you have had the amount that your doctor said to have.  Drink enough clear fluid to keep your pee clear or pale yellow. If you were told to drink an ORS, finish the  ORS first, then start slowly drinking clear fluids. Drink fluids such as: ? Water. Do not drink only water by itself. Doing that can make the salt (sodium) level in your body get too low (hyponatremia). ? Ice chips. ? Fruit juice that you have added water to (diluted). ? Low-calorie sports drinks.  Avoid: ? Alcohol. ? Drinks that have a lot of sugar. These include high-calorie sports drinks, fruit juice that does not have water added, and soda. ? Caffeine. ? Foods that are greasy or have a lot of fat or sugar.  Take over-the-counter and prescription medicines only as told by your doctor.  Do not take salt tablets. Doing that can make the salt level in your body get too high (hypernatremia).  Eat foods that have minerals (electrolytes). Examples include bananas, oranges, potatoes, tomatoes, and spinach.  Keep all follow-up visits as told by your doctor. This is important. Contact a doctor if:  You have belly (abdominal) pain that: ? Gets worse. ? Stays in one area (localizes).  You have a rash.  You have a stiff neck.  You get angry or annoyed more easily than normal (irritability).  You are more sleepy than normal.  You have a harder time waking up than normal.  You feel: ? Weak. ? Dizzy. ? Very thirsty.  You have peed (urinated) only a small amount of very dark pee during 6-8 hours. Get help right away if:  You have   symptoms of very bad dehydration.  You cannot drink fluids without throwing up (vomiting).  Your symptoms get worse with treatment.  You have a fever.  You have a very bad headache.  You are throwing up or having watery poop (diarrhea) and it: ? Gets worse. ? Does not go away.  You have blood or something green (bile) in your throw-up.  You have blood in your poop (stool). This may cause poop to look black and tarry.  You have not peed in 6-8 hours.  You pass out (faint).  Your heart rate when you are sitting still is more than 100 beats a  minute.  You have trouble breathing. This information is not intended to replace advice given to you by your health care provider. Make sure you discuss any questions you have with your health care provider. Document Released: 12/14/2008 Document Revised: 09/07/2015 Document Reviewed: 04/13/2015 Elsevier Interactive Patient Education  2019 Elsevier Inc.  

## 2018-05-21 NOTE — Patient Instructions (Signed)
Pegfilgrastim injection  What is this medicine?  PEGFILGRASTIM (PEG fil gra stim) is a long-acting granulocyte colony-stimulating factor that stimulates the growth of neutrophils, a type of white blood cell important in the body's fight against infection. It is used to reduce the incidence of fever and infection in patients with certain types of cancer who are receiving chemotherapy that affects the bone marrow, and to increase survival after being exposed to high doses of radiation.  This medicine may be used for other purposes; ask your health care provider or pharmacist if you have questions.  COMMON BRAND NAME(S): Fulphila, Neulasta, UDENYCA  What should I tell my health care provider before I take this medicine?  They need to know if you have any of these conditions:  -kidney disease  -latex allergy  -ongoing radiation therapy  -sickle cell disease  -skin reactions to acrylic adhesives (On-Body Injector only)  -an unusual or allergic reaction to pegfilgrastim, filgrastim, other medicines, foods, dyes, or preservatives  -pregnant or trying to get pregnant  -breast-feeding  How should I use this medicine?  This medicine is for injection under the skin. If you get this medicine at home, you will be taught how to prepare and give the pre-filled syringe or how to use the On-body Injector. Refer to the patient Instructions for Use for detailed instructions. Use exactly as directed. Tell your healthcare provider immediately if you suspect that the On-body Injector may not have performed as intended or if you suspect the use of the On-body Injector resulted in a missed or partial dose.  It is important that you put your used needles and syringes in a special sharps container. Do not put them in a trash can. If you do not have a sharps container, call your pharmacist or healthcare provider to get one.  Talk to your pediatrician regarding the use of this medicine in children. While this drug may be prescribed for  selected conditions, precautions do apply.  Overdosage: If you think you have taken too much of this medicine contact a poison control center or emergency room at once.  NOTE: This medicine is only for you. Do not share this medicine with others.  What if I miss a dose?  It is important not to miss your dose. Call your doctor or health care professional if you miss your dose. If you miss a dose due to an On-body Injector failure or leakage, a new dose should be administered as soon as possible using a single prefilled syringe for manual use.  What may interact with this medicine?  Interactions have not been studied.  Give your health care provider a list of all the medicines, herbs, non-prescription drugs, or dietary supplements you use. Also tell them if you smoke, drink alcohol, or use illegal drugs. Some items may interact with your medicine.  This list may not describe all possible interactions. Give your health care provider a list of all the medicines, herbs, non-prescription drugs, or dietary supplements you use. Also tell them if you smoke, drink alcohol, or use illegal drugs. Some items may interact with your medicine.  What should I watch for while using this medicine?  You may need blood work done while you are taking this medicine.  If you are going to need a MRI, CT scan, or other procedure, tell your doctor that you are using this medicine (On-Body Injector only).  What side effects may I notice from receiving this medicine?  Side effects that you should report to   your doctor or health care professional as soon as possible:  -allergic reactions like skin rash, itching or hives, swelling of the face, lips, or tongue  -back pain  -dizziness  -fever  -pain, redness, or irritation at site where injected  -pinpoint red spots on the skin  -red or dark-brown urine  -shortness of breath or breathing problems  -stomach or side pain, or pain at the shoulder  -swelling  -tiredness  -trouble passing urine or  change in the amount of urine  Side effects that usually do not require medical attention (report to your doctor or health care professional if they continue or are bothersome):  -bone pain  -muscle pain  This list may not describe all possible side effects. Call your doctor for medical advice about side effects. You may report side effects to FDA at 1-800-FDA-1088.  Where should I keep my medicine?  Keep out of the reach of children.  If you are using this medicine at home, you will be instructed on how to store it. Throw away any unused medicine after the expiration date on the label.  NOTE: This sheet is a summary. It may not cover all possible information. If you have questions about this medicine, talk to your doctor, pharmacist, or health care provider.   2019 Elsevier/Gold Standard (2017-05-25 16:57:08)

## 2018-05-21 NOTE — Progress Notes (Signed)
Unable to get blood from two peripheral stick attempts in [facility] and [facility].  VO from [place] ok to get just lab draw by phlebotomist, Verdis Frederickson collected sample.  Pt given pain medication for lower back, reports feeling better afterwards.  Pt presents for swelling bilat lower legs, irregular tachycardia, and gen fatigue.  Afebrile.  Denies chills, N/V/D/C or changes in bowels/urination.  A&Ox4.  Uses WC and home oxygen at 2L [place], no recent increase d/t increased SOB/breathing effort.  Denies CP.  Bilat lower leg +2 pitting edema.  Pt takes fluid pill as needed for swelling.  Spouse present for exam.

## 2018-05-24 ENCOUNTER — Other Ambulatory Visit: Payer: Self-pay | Admitting: Physician Assistant

## 2018-05-24 NOTE — Progress Notes (Signed)
[facility] Progress Note   [name] [id number] 1941/02/12 78 y.o.  [name] is managed by Dr. Fanny Bien. Mohamed   Actively treated with chemotherapy/immunotherapy/hormonal therapy: yes  Current therapy: carboplatin and etoposide with Udenyca support  Last treated: 05/19/2018 (cycle 1, day 3)  Next scheduled appointment with provider: 05/27/2018  Assessment: Plan:    Irregular heart beat - Plan: EKG 12-Lead, acetaminophen (TYLENOL) tablet 650 mg  Shortness of breath - Plan: OXYGEN, predniSONE (DELTASONE) 10 MG tablet, predniSONE (DELTASONE) 5 MG tablet, clarithromycin (BIAXIN) 250 MG tablet  Chronic obstructive pulmonary disease, unspecified COPD type ([place]) - Plan: predniSONE (DELTASONE) 10 MG tablet, predniSONE (DELTASONE) 5 MG tablet, clarithromycin (BIAXIN) 250 MG tablet   Irregular heart beat: An EKG was completed and returned showing:  Sinus tachycardia with Premature supraventricular complexes with frequent Premature ventricular complexes RSR' or QR pattern in V1 suggests right ventricular conduction delay Left anterior fascicular block  Shortness of breath in a setting of COPD: The patient was given prednisone 20 mg p.o. x1 and was given a prednisone taper to begin tomorrow.  She was also given a prescription for Biaxin.  Extensive stage small cell lung cancer: The patient is status post cycle 1 of carboplatin and etoposide with Udenyca support.  She is scheduled to return for follow-up on 05/27/2018.  Please see After Visit Summary for patient specific instructions.  Future Appointments  Date Time Provider [place]  05/25/2018  1:00 PM [facility] RO[facility] None  05/25/2018  2:30 PM [facility] 1 [facility] [place]  05/27/2018  8:00 AM [facility] LAB 1 [facility] None  05/27/2018  8:30 AM Heilingoetter, Cassandra L, PA-C [facility] None  05/31/2018  2:00 PM [facility] LAB 6 [facility] None  06/07/2018  9:15 AM [facility] LAB 6  [facility] None  06/07/2018 10:00 AM Heilingoetter, Cassandra L, PA-C [facility] None  06/07/2018 11:00 AM [facility] INFUSION [facility] None  06/08/2018  2:00 PM [facility] INFUSION [facility] None  06/09/2018  2:00 PM [facility] INFUSION [facility] None  06/11/2018  2:00 PM [facility] [place] FLUSH [facility] None  06/14/2018  2:00 PM [facility] LAB 6 [facility] None  06/21/2018  2:00 PM [facility] LAB 3 [facility] None  06/28/2018 10:00 AM [facility] LAB 6 [facility] None  06/28/2018 10:45 AM Curt Bears, MD [facility] None  06/28/2018 11:30 AM [facility] INFUSION [facility] None  06/29/2018  2:00 PM [facility] INFUSION [facility] None  06/30/2018  2:00 PM [facility] INFUSION [facility] None  07/02/2018  2:00 PM [facility] [place] FLUSH [facility] None    Orders Placed This Encounter  Procedures  . EKG 12-Lead       Subjective:   Patient ID:  [name] is a 78 y.o. (DOB 1940/04/28) female.  Chief Complaint:  Chief Complaint  Patient presents with  . Fatigue    HPI [name] is a 78 year old female with a history of an extensive stage small cell lung cancer who is managed by Dr. Julien Nordmann.  She is status post cycle 1 of carboplatin and etoposide.  She presented today for her you did a good injection with her and she was found to be tachycardic with a pulse of 120 and tachypneic with respirations of 22 despite receiving oxygen via nasal cannula at 2 L/min.  She has a nonproductive cough.  She reports that she is weak.  Her oxygen saturation was found to be 86% on 2 L via nasal cannula.  She denies fevers, chills, or sweats.  Her husband asked if a wheelchair can  be provided through advanced home care.  Medications: I have reviewed the patient's current medications.  Allergies:  Allergies  Allergen Reactions  . Morphine Shortness Of Breath, Swelling and Rash  . Oxycodone-Acetaminophen Shortness Of Breath    Decreased O2 sats per patient and her husband  . Celecoxib  Other (See Comments) and Rash    Makes stomach burn  . Ciprofloxacin Rash    Other reaction(s): Unknown  . Codeine Rash  . Iodine Other (See Comments) and Rash    Betadine blisters  . Levaquin  [Levofloxacin In D5w] Rash  . Lisinopril     Other reaction(s): Dizziness  . Losartan     Other reaction(s): Dizziness  . Midazolam Rash  . Penicillins Rash  . Statins     Other reaction(s): Dizziness  . Atorvastatin   . Citalopram   . Colesevelam   . Fenofibrate   . Tramadol Hcl   . Aleve [Naproxen Sodium] Rash  . Azithromycin Rash  . Cyclobenzaprine Rash  . Erythromycin Rash  . Fosamax [Alendronate] Rash  . Meloxicam Other (See Comments)    Makes stomach burn  . Moxifloxacin Rash  . Sulfonamide Derivatives Rash    Past Medical History:  Diagnosis Date  . COPD (chronic obstructive pulmonary disease) ([place])   . GERD (gastroesophageal reflux disease)   . Tachycardia     Past Surgical History:  Procedure Laterality Date  . APPENDECTOMY    . arthroscopc     total knee  . PARTIAL HYSTERECTOMY    . ruptured disk      Family History  Problem Relation Age of Onset  . Coronary artery disease Unknown     Social History   Socioeconomic History  . Marital status: Married    Spouse name: Not on file  . Number of children: Not on file  . Years of education: Not on file  . Highest education level: Not on file  Occupational History  . Not on file  Social Needs  . Financial resource strain: Not on file  . Food insecurity:    Worry: Not on file    Inability: Not on file  . Transportation needs:    Medical: Not on file    Non-medical: Not on file  Tobacco Use  . Smoking status: Former Smoker    Last attempt to quit: 10/16/2011    Years since quitting: 6.6  . Smokeless tobacco: Never Used  Substance and Sexual Activity  . Alcohol use: No  . Drug use: No  . Sexual activity: Not on file  Lifestyle  . Physical activity:    Days per week: Not on file    Minutes per  session: Not on file  . Stress: Not on file  Relationships  . Social connections:    Talks on phone: Not on file    Gets together: Not on file    Attends religious service: Not on file    Active member of club or organization: Not on file    Attends meetings of clubs or organizations: Not on file    Relationship status: Not on file  . Intimate partner violence:    Fear of current or ex partner: Not on file    Emotionally abused: Not on file    Physically abused: Not on file    Forced sexual activity: Not on file  Other Topics Concern  . Not on file  Social History Narrative  . Not on file    Past Medical History, Surgical history,  Social history, and Family history were reviewed and updated as appropriate.   Please see review of systems for further details on the patient's review from today.   Review of Systems:  Review of Systems  Constitutional: Negative for chills, diaphoresis and fever.  HENT: Negative for trouble swallowing and voice change.   Respiratory: Positive for shortness of breath. Negative for cough, choking, chest tightness, wheezing and stridor.   Cardiovascular: Negative for chest pain and palpitations.  Gastrointestinal: Negative for abdominal pain, constipation, diarrhea, nausea and vomiting.  Musculoskeletal: Negative for back pain and myalgias.  Neurological: Negative for dizziness, light-headedness and headaches.    Objective:   Physical Exam:  BP 138/76 (BP Location: Right Arm, Patient Position: Sitting)   Pulse (!) 35   Temp 98.6 F (37 C) (Oral)   Resp (!) 24   LMP  (LMP Unknown) Comment: Menopause  SpO2 96% Comment: 2L [place] ECOG: 2  Physical Exam Constitutional:      General: She is not in acute distress.    Appearance: She is not diaphoretic.  HENT:     Head: Normocephalic and atraumatic.     Right Ear: External ear normal.     Left Ear: External ear normal.     Mouth/Throat:     Pharynx: No oropharyngeal exudate.  Neck:      Musculoskeletal: Normal range of motion and neck supple.  Cardiovascular:     Rate and Rhythm: Tachycardia present. Rhythm regularly irregular.     Heart sounds: Normal heart sounds. No murmur. No friction rub. No gallop.   Pulmonary:     Effort: Pulmonary effort is normal. No respiratory distress.     Breath sounds: Wheezing (Bibasilar wheezes) present. No rales.  Lymphadenopathy:     Cervical: No cervical adenopathy.  Skin:    General: Skin is warm and dry.     Findings: No erythema or rash.  Neurological:     Mental Status: She is alert.     Coordination: Coordination normal.     Gait: Gait abnormal (The patient is ambulating with the use of a wheelchair.).  Psychiatric:        Behavior: Behavior normal.        Thought Content: Thought content normal.        Judgment: Judgment normal.     Lab Review:     Component Value Date/Time   NA 142 05/21/2018 1412   K 4.3 05/21/2018 1412   CL 102 05/21/2018 1412   CO2 29 05/21/2018 1412   GLUCOSE 88 05/21/2018 1412   BUN 38 (H) 05/21/2018 1412   CREATININE 0.88 05/21/2018 1412   CALCIUM 8.4 (L) 05/21/2018 1412   PROT 5.8 (L) 05/21/2018 1412   ALBUMIN 2.9 (L) 05/21/2018 1412   AST 47 (H) 05/21/2018 1412   ALT 31 05/21/2018 1412   ALKPHOS 117 05/21/2018 1412   BILITOT 0.5 05/21/2018 1412   GFRNONAA >60 05/21/2018 1412   GFRAA >60 05/21/2018 1412       Component Value Date/Time   WBC 15.3 (H) 05/21/2018 1412   WBC 15.4 (H) 05/04/2018 1054   RBC 3.82 (L) 05/21/2018 1412   HGB 11.9 (L) 05/21/2018 1412   HCT 38.1 05/21/2018 1412   PLT 266 05/21/2018 1412   MCV 99.7 05/21/2018 1412   MCH 31.2 05/21/2018 1412   MCHC 31.2 05/21/2018 1412   RDW 14.6 05/21/2018 1412   LYMPHSABS 1.0 05/21/2018 1412   MONOABS 0.1 05/21/2018 1412   EOSABS 0.4 05/21/2018 1412  BASOSABS 0.1 05/21/2018 1412   -------------------------------  Imaging from last 24 hours (if applicable):  Radiology interpretation: Mr Jeri Cos LX Contrast   Result Date: 05/19/2018 CLINICAL DATA:  Small cell lung cancer.  Restaging. EXAM: MRI HEAD WITHOUT AND WITH CONTRAST TECHNIQUE: Multiplanar, multiecho pulse sequences of the brain and surrounding structures were obtained without and with intravenous contrast. CONTRAST:  6 cc Gadavist COMPARISON:  None. FINDINGS: Brain: No sign of old or current metastatic disease. Diffusion imaging shows a punctate focus of restricted diffusion within the right parietal subcortical white matter likely to represent a recent small vessel infarction. Elsewhere, there chronic small-vessel ischemic changes of the pons, cerebellum and cerebral hemispheric white matter. No cortical or large vessel territory infarction. No hemorrhage, hydrocephalus or extra-axial collection. No abnormal contrast enhancement in the region. Vascular: Major vessels at the base of the brain show flow. Skull and upper cervical spine: Negative Sinuses/Orbits: Clear/normal Other: None IMPRESSION: No evidence of active or treated metastatic disease. 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 Likely incidental punctate acute or subacute white matter infarction in the right parietal subcortical white matter. Electronically Signed   By: Nelson Chimes M.D.   On: 05/19/2018 16:14   US Biopsy (liver)  Result Date: 05/04/2018 INDICATION: Metastatic lung cancer, liver metastases EXAM: ULTRASOUND GUIDED CORE BIOPSY OF LEFT LIVER METASTASIS MEDICATIONS: 1% lidocaine local ANESTHESIA/SEDATION: 0.5 mg Ativan moderate Sedation Time:  10 minutes The patient was continuously monitored during the procedure by the interventional radiology nurse under my direct supervision. FLUOROSCOPY TIME:  Fluoroscopy Time: None. COMPLICATIONS: None immediate. PROCEDURE: The procedure, risks, benefits, and alternatives were explained to the patient. Questions regarding the procedure were encouraged and answered. The patient understands and consents to the procedure. Previous  imaging reviewed. Preliminary ultrasound performed. Hypoechoic solid lesions in the left hepatic lobe were localized through a subxiphoid window. Overlying skin marked. Under sterile conditions and local anesthesia, a 17 gauge 6.8 cm access needle was advanced from a subxiphoid approach to a left hepatic lesion. Needle position confirmed with ultrasound. 3 18 gauge core biopsies obtained through the lesion. Images obtained for documentation. Postprocedure imaging demonstrates no hemorrhage or hematoma. Patient tolerated biopsy well. FINDINGS: Imaging confirms needle placed into a left hepatic metastasis core biopsy. IMPRESSION: Successful ultrasound left hepatic metastasis 18 gauge core biopsy Electronically Signed   By: Jerilynn Mages.  Shick M.D.   On: 05/04/2018 15:00

## 2018-05-25 ENCOUNTER — Ambulatory Visit (HOSPITAL_COMMUNITY)
Admission: RE | Admit: 2018-05-25 | Discharge: 2018-05-25 | Disposition: A | Discharge status: Home / Self Care | Payer: Medicare Other | Source: Ambulatory Visit | Attending: Internal Medicine | Admitting: Internal Medicine

## 2018-05-25 ENCOUNTER — Telehealth: Payer: Self-pay | Admitting: Medical Oncology

## 2018-05-25 ENCOUNTER — Telehealth: Payer: Self-pay | Admitting: Physician Assistant

## 2018-05-25 ENCOUNTER — Inpatient Hospital Stay (HOSPITAL_BASED_OUTPATIENT_CLINIC_OR_DEPARTMENT_OTHER): Payer: Medicare Other | Admitting: Medical

## 2018-05-25 ENCOUNTER — Encounter (HOSPITAL_COMMUNITY): Payer: Self-pay

## 2018-05-25 ENCOUNTER — Other Ambulatory Visit: Payer: Self-pay | Admitting: Internal Medicine

## 2018-05-25 ENCOUNTER — Other Ambulatory Visit: Payer: Self-pay

## 2018-05-25 DIAGNOSIS — Z886 Allergy status to analgesic agent status: Secondary | ICD-10-CM | POA: Diagnosis not present

## 2018-05-25 DIAGNOSIS — R000 Tachycardia, unspecified: Secondary | ICD-10-CM | POA: Insufficient documentation

## 2018-05-25 DIAGNOSIS — C787 Secondary malignant neoplasm of liver and intrahepatic bile duct: Secondary | ICD-10-CM | POA: Insufficient documentation

## 2018-05-25 DIAGNOSIS — Z882 Allergy status to sulfonamides status: Secondary | ICD-10-CM | POA: Diagnosis not present

## 2018-05-25 DIAGNOSIS — I878 Other specified disorders of veins: Secondary | ICD-10-CM

## 2018-05-25 DIAGNOSIS — Z79899 Other long term (current) drug therapy: Secondary | ICD-10-CM | POA: Diagnosis not present

## 2018-05-25 DIAGNOSIS — Z888 Allergy status to other drugs, medicaments and biological substances status: Secondary | ICD-10-CM | POA: Diagnosis not present

## 2018-05-25 DIAGNOSIS — C7951 Secondary malignant neoplasm of bone: Secondary | ICD-10-CM | POA: Diagnosis not present

## 2018-05-25 DIAGNOSIS — J449 Chronic obstructive pulmonary disease, unspecified: Secondary | ICD-10-CM | POA: Diagnosis not present

## 2018-05-25 DIAGNOSIS — K219 Gastro-esophageal reflux disease without esophagitis: Secondary | ICD-10-CM | POA: Insufficient documentation

## 2018-05-25 DIAGNOSIS — Z881 Allergy status to other antibiotic agents status: Secondary | ICD-10-CM | POA: Diagnosis not present

## 2018-05-25 DIAGNOSIS — Z880 Allergy status to penicillin: Secondary | ICD-10-CM | POA: Diagnosis not present

## 2018-05-25 DIAGNOSIS — Z9981 Dependence on supplemental oxygen: Secondary | ICD-10-CM | POA: Diagnosis not present

## 2018-05-25 DIAGNOSIS — T80818D Extravasation of other vesicant agent, subsequent encounter: Secondary | ICD-10-CM

## 2018-05-25 DIAGNOSIS — C3490 Malignant neoplasm of unspecified part of unspecified bronchus or lung: Secondary | ICD-10-CM | POA: Insufficient documentation

## 2018-05-25 DIAGNOSIS — Z885 Allergy status to narcotic agent status: Secondary | ICD-10-CM | POA: Diagnosis not present

## 2018-05-25 HISTORY — DX: Dyspnea, unspecified: R06.00

## 2018-05-25 HISTORY — DX: Dependence on supplemental oxygen: Z99.81

## 2018-05-25 HISTORY — PX: IR IMAGING GUIDED PORT INSERTION: IMG5740

## 2018-05-25 LAB — CBC
HCT: 37.2 % (ref 36.0–46.0)
Hemoglobin: 10.9 g/dL — ABNORMAL LOW (ref 12.0–15.0)
MCH: 30.8 pg (ref 26.0–34.0)
MCHC: 29.3 g/dL — ABNORMAL LOW (ref 30.0–36.0)
MCV: 105.1 fL — ABNORMAL HIGH (ref 80.0–100.0)
NRBC: 0 % (ref 0.0–0.2)
Platelets: 104 10*3/uL — ABNORMAL LOW (ref 150–400)
RBC: 3.54 MIL/uL — ABNORMAL LOW (ref 3.87–5.11)
RDW: 14.2 % (ref 11.5–15.5)
WBC: 1.3 10*3/uL — CL (ref 4.0–10.5)

## 2018-05-25 LAB — PROTIME-INR
INR: 0.9 (ref 0.8–1.2)
Prothrombin Time: 11.6 seconds (ref 11.4–15.2)

## 2018-05-25 MED ORDER — LIDOCAINE HCL 1 % IJ SOLN
INTRAMUSCULAR | Status: AC
  Filled 2018-05-25: qty 20

## 2018-05-25 MED ORDER — FENTANYL CITRATE (PF) 100 MCG/2ML IJ SOLN
INTRAMUSCULAR | Status: AC | PRN
  Administered 2018-05-25 (×2): 50 ug via INTRAVENOUS

## 2018-05-25 MED ORDER — VANCOMYCIN HCL IN DEXTROSE 1-5 GM/200ML-% IV SOLN
1000.0000 mg | Freq: Once | INTRAVENOUS | Status: AC
  Administered 2018-05-25: 1000 mg via INTRAVENOUS

## 2018-05-25 MED ORDER — FENTANYL CITRATE (PF) 100 MCG/2ML IJ SOLN
INTRAMUSCULAR | Status: AC
  Filled 2018-05-25: qty 2

## 2018-05-25 MED ORDER — SODIUM CHLORIDE 0.9 % IV SOLN
INTRAVENOUS | Status: DC
  Administered 2018-05-25: 13:00:00 via INTRAVENOUS

## 2018-05-25 MED ORDER — VANCOMYCIN HCL IN DEXTROSE 1-5 GM/200ML-% IV SOLN
INTRAVENOUS | Status: AC
  Administered 2018-05-25: 1000 mg via INTRAVENOUS
  Filled 2018-05-25: qty 200

## 2018-05-25 MED ORDER — HEPARIN SOD (PORK) LOCK FLUSH 100 UNIT/ML IV SOLN
INTRAVENOUS | Status: AC | PRN
  Administered 2018-05-25: 500 [IU] via INTRAVENOUS

## 2018-05-25 MED ORDER — LIDOCAINE-EPINEPHRINE (PF) 2 %-1:200000 IJ SOLN
INTRAMUSCULAR | Status: AC
  Filled 2018-05-25: qty 20

## 2018-05-25 MED ORDER — LORAZEPAM 2 MG/ML IJ SOLN
1.0000 mg | Freq: Once | INTRAMUSCULAR | Status: AC
  Administered 2018-05-25: 1 mg via INTRAVENOUS

## 2018-05-25 MED ORDER — HEPARIN SOD (PORK) LOCK FLUSH 100 UNIT/ML IV SOLN
INTRAVENOUS | Status: AC
  Filled 2018-05-25: qty 5

## 2018-05-25 NOTE — Telephone Encounter (Signed)
Called patient and left a message about her scheduled appointment on 3/26. The patient does not need to come in for an office visit appointment or lab visit on 3/26.  Left number to our office should she have any questions or concerns.

## 2018-05-25 NOTE — Progress Notes (Signed)
Pt seen by PA Lucianne Lei before port appt for extravasation/infiltration recheck in lobby.  No RN assessment at this time or VS per VO from PA [employer].

## 2018-05-25 NOTE — Procedures (Signed)
Interventional Radiology Procedure:   Indications: Small cell lung cancer  Procedure: Port placement  Findings: Right jugular port, tip at SVC/RA junction  Complications: None     EBL: Minimal, less than 10 ml  Plan: Discharge in one hour.  Keep port site and incisions dry for at least 24 hours.     Catherine Haydu R. Anselm Pancoast, MD  Pager: [phone]

## 2018-05-25 NOTE — Discharge Instructions (Addendum)
DO NOT USE EMLA CREAM UNTIL THE CANCER CENTER NURSES TELL YOU TO. The cream will dissolve the skin glue which holds the skin together. It may result in infection over your new port. You can use ice in a zip lock bag for 5 minutes prior to nurses assessing your port.   Moderate Conscious Sedation, Adult, Care After These instructions provide you with information about caring for yourself after your procedure. Your health care provider may also give you more specific instructions. Your treatment has been planned according to current medical practices, but problems sometimes occur. Call your health care provider if you have any problems or questions after your procedure. What can I expect after the procedure? After your procedure, it is common:  To feel sleepy for several hours.  To feel clumsy and have poor balance for several hours.  To have poor judgment for several hours.  To vomit if you eat too soon. Follow these instructions at home: For at least 24 hours after the procedure:   Do not: ? Participate in activities where you could fall or become injured. ? Drive. ? Use heavy machinery. ? Drink alcohol. ? Take sleeping pills or medicines that cause drowsiness. ? Make important decisions or sign legal documents. ? Take care of children on your own.  Rest. Eating and drinking  Follow the diet recommended by your health care provider.  If you vomit: ? Drink water, juice, or soup when you can drink without vomiting. ? Make sure you have little or no nausea before eating solid foods. General instructions  Have a responsible adult stay with you until you are awake and alert.  Take over-the-counter and prescription medicines only as told by your health care provider.  If you smoke, do not smoke without supervision.  Keep all follow-up visits as told by your health care provider. This is important. Contact a health care provider if:  You keep feeling nauseous or you keep  vomiting.  You feel light-headed.  You develop a rash.  You have a fever. Get help right away if:  You have trouble breathing. This information is not intended to replace advice given to you by your health care provider. Make sure you discuss any questions you have with your health care provider. Document Released: 12/08/2012 Document Revised: 07/23/2015 Document Reviewed: 06/09/2015 Elsevier Interactive Patient Education  2019 [place] An implanted port is a device that is placed under the skin. It is usually placed in the chest. The device can be used to give IV medicine, to take blood, or for dialysis. You may have an implanted port if:  You need IV medicine that would be irritating to the small veins in your hands or arms.  You need IV medicines, such as antibiotics, for a long period of time.  You need IV nutrition for a long period of time.  You need dialysis. Having a port means that your health care provider will not need to use the veins in your arms for these procedures. You may have fewer limitations when using a port than you would if you used other types of long-term IVs, and you will likely be able to return to normal activities after your incision heals. An implanted port has two main parts:  Reservoir. The reservoir is the part where a needle is inserted to give medicines or draw blood. The reservoir is round. After it is placed, it appears as a small, raised area under your  skin.  Catheter. The catheter is a thin, flexible tube that connects the reservoir to a vein. Medicine that is inserted into the reservoir goes into the catheter and then into the vein. How is my port accessed? To access your port:  A numbing cream may be placed on the skin over the port site.  Your health care provider will put on a mask and sterile gloves.  The skin over your port will be cleaned carefully with a germ-killing soap and allowed to  dry.  Your health care provider will gently pinch the port and insert a needle into it.  Your health care provider will check for a blood return to make sure the port is in the vein and is not clogged.  If your port needs to remain accessed to get medicine continuously (constant infusion), your health care provider will place a clear bandage (dressing) over the needle site. The dressing and needle will need to be changed every week, or as told by your health care provider. What is flushing? Flushing helps keep the port from getting clogged. Follow instructions from your health care provider about how and when to flush the port. Ports are usually flushed with saline solution or a medicine called heparin. The need for flushing will depend on how the port is used:  If the port is only used from time to time to give medicines or draw blood, the port may need to be flushed: ? Before and after medicines have been given. ? Before and after blood has been drawn. ? As part of routine maintenance. Flushing may be recommended every 4-6 weeks.  If a constant infusion is running, the port may not need to be flushed.  Throw away any syringes in a disposal container that is meant for sharp items (sharps container). You can buy a sharps container from a pharmacy, or you can make one by using an empty hard plastic bottle with a cover. How long will my port stay implanted? The port can stay in for as long as your health care provider thinks it is needed. When it is time for the port to come out, a surgery will be done to remove it. The surgery will be similar to the procedure that was done to put the port in. Follow these instructions at home:   Flush your port as told by your health care provider.  If you need an infusion over several days, follow instructions from your health care provider about how to take care of your port site. Make sure you: ? Wash your hands with soap and water before you change your  dressing. If soap and water are not available, use alcohol-based hand sanitizer. ? Change your dressing as told by your health care provider. ? Place any used dressings or infusion bags into a plastic bag. Throw that bag in the trash. ? Keep the dressing that covers the needle clean and dry. Do not get it wet. ? Do not use scissors or sharp objects near the tube. ? Keep the tube clamped, unless it is being used.  Check your port site every day for signs of infection. Check for: ? Redness, swelling, or pain. ? Fluid or blood. ? Pus or a bad smell.  Protect the skin around the port site. ? Avoid wearing bra straps that rub or irritate the site. ? Protect the skin around your port from seat belts. Place a soft pad over your chest if needed.  Bathe or shower as told  by your health care provider. The site may get wet as long as you are not actively receiving an infusion.  Return to your normal activities as told by your health care provider. Ask your health care provider what activities are safe for you.  Carry a medical alert card or wear a medical alert bracelet at all times. This will let health care providers know that you have an implanted port in case of an emergency. Get help right away if:  You have redness, swelling, or pain at the port site.  You have fluid or blood coming from your port site.  You have pus or a bad smell coming from the port site.  You have a fever. Summary  Implanted ports are usually placed in the chest for long-term IV access.  Follow instructions from your health care provider about flushing the port and changing bandages (dressings).  Take care of the area around your port by avoiding clothing that puts pressure on the area, and by watching for signs of infection.  Protect the skin around your port from seat belts. Place a soft pad over your chest if needed.  Get help right away if you have a fever or you have redness, swelling, pain, drainage, or a  bad smell at the port site. This information is not intended to replace advice given to you by your health care provider. Make sure you discuss any questions you have with your health care provider. Document Released: 02/17/2005 Document Revised: 03/22/2016 Document Reviewed: 03/22/2016 Elsevier Interactive Patient Education  2019 [employer].

## 2018-05-25 NOTE — H&P (Addendum)
Referring Physician(s): Mohamed,Mohamed  Supervising Physician: Markus Daft  Patient Status:  [facility]  Chief Complaint:  "I'm here for a port"  Subjective: Pt familiar to IR service from prior liver lesion biopsy on 05/04/2018. 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  She has poor venous access and presents again today for Port-A-Cath placement for palliative chemotherapy.  She denies fever, headache, chest pain, abdominal pain, nausea, vomiting or bleeding.  She does have dyspnea with exertion, cough and back pain.  Past Medical History:  Diagnosis Date  . COPD (chronic obstructive pulmonary disease) ([place])   . Dyspnea   . GERD (gastroesophageal reflux disease)   . On home oxygen therapy   . Tachycardia    Past Surgical History:  Procedure Laterality Date  . APPENDECTOMY    . arthroscopc     total knee  . BACK SURGERY    . FOOT SURGERY    . knee surgery bilateral    . PARTIAL HYSTERECTOMY    . ruptured disk       Allergies: Morphine; Oxycodone-acetaminophen; Celecoxib; Ciprofloxacin; Codeine; Iodine; Levaquin  [levofloxacin in d5w]; Lisinopril; Losartan; Midazolam; Nsaids; Penicillins; Statins; Atorvastatin; Citalopram; Colesevelam; Fenofibrate; Tramadol hcl; Aleve [naproxen sodium]; Azithromycin; Cyclobenzaprine; Erythromycin; Fosamax [alendronate]; Meloxicam; Moxifloxacin; and Sulfonamide derivatives  Medications: Prior to Admission medications   Medication Sig Start Date End Date Taking? Authorizing Provider  acetaminophen (TYLENOL) 325 MG tablet Take by mouth. arthritis   Yes [provider]  albuterol (PROVENTIL HFA;VENTOLIN HFA) 108 (90 Base) MCG/ACT inhaler Inhale into the lungs every 6 (six) hours as needed for wheezing or shortness of breath.   Yes [provider]   budesonide-formoterol (SYMBICORT) 160-4.5 MCG/ACT inhaler Inhale 2 puffs into the lungs 2 (two) times daily.   Yes [provider]  carvedilol (COREG) 6.25 MG tablet Take 1 tablet (6.25 mg total) by mouth 2 (two) times daily with a meal. 01/12/18  Yes Branch, Alphonse Guild, MD  clarithromycin (BIAXIN) 250 MG tablet Take 1 tablet (250 mg total) by mouth 2 (two) times daily. 05/21/18  Yes Tanner, Lyndon Code., PA-C  diphenhydrAMINE (BENADRYL) 25 MG tablet Take by mouth. As needed for allergies   Yes [provider]  gabapentin (NEURONTIN) 300 MG capsule Take 300 mg by mouth 2 (two) times daily.  09/25/15  Yes [provider]  ipratropium (ATROVENT) 0.02 % nebulizer solution Take 0.5 mg by nebulization 4 (four) times daily as needed for wheezing or shortness of breath.   Yes [provider]  losartan (COZAAR) 25 MG tablet Take 1 tablet (25 mg total) by mouth daily. 01/12/18  Yes BranchAlphonse Guild, MD  omeprazole (PRILOSEC) 20 MG capsule Take by mouth.   Yes [provider]  OXYGEN Inhale 2-3 L into the lungs.   Yes [provider]  pravastatin (PRAVACHOL) 80 MG tablet TAKE ONE TABLET BY MOUTH ONCE DAILY IN THE EVENING 06/21/15  Yes [provider]  predniSONE (DELTASONE) 10 MG tablet Take 10 mg by mouth daily. 04/21/18  Yes [provider]  predniSONE (DELTASONE) 5 MG tablet 6 tab x 1 day, 5 tab x 1 day, 4 tab x 1 day, 3 tab x 1 day, 2 tab x 1 day, 1 tab x 1 day, stop 05/21/18  Yes Tanner, Lyndon Code., PA-C  torsemide [facility])  20 MG tablet TAKE 20 MG DAILY MAY TAKE ADDITIONAL 20 MG AS NEEDED FOR SWELLING 01/12/18  Yes Branch, Alphonse Guild, MD  Azelastine HCl 0.15 % SOLN 1-2 sprays each nostril 2 times per day as needed. 10/16/15   Bobbitt, Sedalia Muta, MD  lidocaine-prilocaine (EMLA) cream Apply 1 application topically as needed. Apply on skin over port site 1-2 hours prior to treatment.  [place]ove with plastic wrap. Patient not taking: Reported on  05/21/2018 05/10/18   Curt Bears, MD  prochlorperazine (COMPAZINE) 10 MG tablet Take 1 tablet (10 mg total) by mouth every 6 (six) hours as needed for nausea or vomiting. 05/08/18   Curt Bears, MD  triamcinolone (NASACORT) 55 MCG/ACT AERO nasal inhaler Place into the nose.    [provider]     Vital Signs: BP (!) 179/113   Pulse 94   Temp 97.8 F (36.6 C) (Oral)   Resp 18   LMP  (LMP Unknown) Comment: Menopause  SpO2 95%   Physical Exam awake, alert.  Chest with distant breath sounds bilaterally.  Heart with normal rate, occasional ectopy noted; abd soft, positive bowel sounds, nontender.  Extremities with full range of motion.  Imaging: No results found.  Labs:  CBC: Recent Labs    05/04/18 1054 05/07/18 0819 05/17/18 0746 05/21/18 1412  WBC 15.4* 14.4* 14.2* 15.3*  HGB 11.8* 11.1* 11.4* 11.9*  HCT 35.2* 33.1* 36.1 38.1  PLT 469* 416* 338 266    COAGS: Recent Labs    05/04/18 1054  INR 0.9    BMP: Recent Labs    05/07/18 0906 05/17/18 0746 05/21/18 1412  NA 124* 142 142  K 3.5 3.4* 4.3  CL 84* 97* 102  CO2 30 30 29   GLUCOSE 97 114* 88  BUN 18 39* 38*  CALCIUM 8.5* 9.0 8.4*  CREATININE 0.85 1.19* 0.88  GFRNONAA >60 44* >60  GFRAA >60 51* >60    LIVER FUNCTION TESTS: Recent Labs    05/07/18 0906 05/17/18 0746 05/21/18 1412  BILITOT 0.5 0.3 0.5  AST 34 37 47*  ALT 18 32 31  ALKPHOS 98 141* 117  PROT 6.1* 6.1* 5.8*  ALBUMIN 3.4* 3.1* 2.9*    Assessment and Plan: Pt with history of recently diagnosed small cell lung carcinoma who presented with right middle lobe lung mass in addition to right hilar and mediastinal lymphadenopathy as well as 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  She has poor venous access and presents  today for Port-A-Cath placement for palliative chemotherapy. Risks and benefits of image guided port-a-catheter placement was discussed with the patient including, but not limited to bleeding, infection,  pneumothorax, or fibrin sheath development and need for additional procedures.  All of the patient's questions were answered, patient is agreeable to proceed. Consent signed and in chart.  Dr. Julien Nordmann made aware of pt's latest WBC count(1.3) and said to proceed with port placement   Electronically Signed: D. Rowe Robert, PA-C 05/25/2018, 1:22 PM   I spent a total of 25 minutes at the the patient's bedside AND on the patient's hospital floor or unit, greater than 50% of which was counseling/coordinating care for port a cath placement

## 2018-05-25 NOTE — Telephone Encounter (Signed)
States she was told to come today for someone to look at her arm at extravasation site. He said someone wrote it on her discharge papers. I told husband Sandi Mealy coming to see pt.

## 2018-05-25 NOTE — Progress Notes (Addendum)
CRITICAL VALUE ALERT  Critical Value:  WBC 1.3  Date & Time Notied: 05/26/2018 1:59pm  Provider Notified: Rowe Robert, PA  Orders Received/Actions taken: no orders given

## 2018-05-26 ENCOUNTER — Telehealth: Payer: Self-pay | Admitting: Medical Oncology

## 2018-05-26 ENCOUNTER — Other Ambulatory Visit: Payer: Self-pay | Admitting: Medical Oncology

## 2018-05-26 NOTE — Progress Notes (Signed)
The patient was seen today in follow-up of an extravasation of etoposide on 05/17/2018  in her left dorsal forearm.  The area shows continued improving bruising bruising with no evidence of vesicles, skin breakdown, or exudate.    Sandi Mealy MHS, PA-C Physician Assistant

## 2018-05-26 NOTE — Telephone Encounter (Addendum)
Re Wheelchair - [facility] has not seen pt since May.

## 2018-05-27 ENCOUNTER — Inpatient Hospital Stay: Payer: Medicare Other | Admitting: Physician Assistant

## 2018-05-27 ENCOUNTER — Telehealth: Payer: Self-pay | Admitting: Medical Oncology

## 2018-05-27 ENCOUNTER — Other Ambulatory Visit: Payer: Self-pay

## 2018-05-27 ENCOUNTER — Inpatient Hospital Stay: Payer: Medicare Other

## 2018-05-27 ENCOUNTER — Telehealth: Payer: Self-pay | Admitting: Physician Assistant

## 2018-05-27 DIAGNOSIS — C3491 Malignant neoplasm of unspecified part of right bronchus or lung: Secondary | ICD-10-CM

## 2018-05-27 DIAGNOSIS — Z5189 Encounter for other specified aftercare: Secondary | ICD-10-CM | POA: Diagnosis not present

## 2018-05-27 LAB — CMP (CANCER CENTER ONLY)
ALK PHOS: 115 U/L (ref 38–126)
ALT: 16 U/L (ref 0–44)
AST: 15 U/L (ref 15–41)
Albumin: 2.8 g/dL — ABNORMAL LOW (ref 3.5–5.0)
Anion gap: 11 (ref 5–15)
BUN: 24 mg/dL — ABNORMAL HIGH (ref 8–23)
CO2: 27 mmol/L (ref 22–32)
CREATININE: 0.83 mg/dL (ref 0.44–1.00)
Calcium: 8.4 mg/dL — ABNORMAL LOW (ref 8.9–10.3)
Chloride: 104 mmol/L (ref 98–111)
GFR, Est AFR Am: >60 mL/min (ref >60)
GFR, Estimated: >60 mL/min (ref >60)
Glucose, Bld: 94 mg/dL (ref 70–99)
Potassium: 4 mmol/L (ref 3.5–5.1)
Sodium: 142 mmol/L (ref 135–145)
Total Bilirubin: 0.5 mg/dL (ref 0.3–1.2)
Total Protein: 5.9 g/dL — ABNORMAL LOW (ref 6.5–8.1)

## 2018-05-27 LAB — CBC WITH DIFFERENTIAL (CANCER CENTER ONLY)
ABS IMMATURE GRANULOCYTES: 0 10*3/uL (ref 0.00–0.07)
Basophils Absolute: 0 10*3/uL (ref 0.0–0.1)
Basophils Relative: 1 %
EOS PCT: 1 %
Eosinophils Absolute: 0 10*3/uL (ref 0.0–0.5)
HCT: 35.2 % — ABNORMAL LOW (ref 36.0–46.0)
Hemoglobin: 10.7 g/dL — ABNORMAL LOW (ref 12.0–15.0)
Immature Granulocytes: 0 %
LYMPHS ABS: 0.5 10*3/uL — AB (ref 0.7–4.0)
Lymphocytes Relative: 51 %
MCH: 31.1 pg (ref 26.0–34.0)
MCHC: 30.4 g/dL (ref 30.0–36.0)
MCV: 102.3 fL — ABNORMAL HIGH (ref 80.0–100.0)
Monocytes Absolute: 0.1 10*3/uL (ref 0.1–1.0)
Monocytes Relative: 8 %
Neutro Abs: 0.4 10*3/uL — CL (ref 1.7–7.7)
Neutrophils Relative %: 39 %
Platelet Count: 81 10*3/uL — ABNORMAL LOW (ref 150–400)
RBC: 3.44 MIL/uL — ABNORMAL LOW (ref 3.87–5.11)
RDW: 14 % (ref 11.5–15.5)
WBC Count: 1 10*3/uL — ABNORMAL LOW (ref 4.0–10.5)
nRBC: 0 % (ref 0.0–0.2)

## 2018-05-27 NOTE — Telephone Encounter (Signed)
Generalized lower extremity weakness in pt with small cell lung cancer with mets to bone and liver. On 05/25/2018 She came to cancer center in a wheelchair . She states she can only walk 1-2 steps and "gives out. My legs can't hold me up. ". She is unable to get to bathroom so she has a BSC in her living room. She is unable to do laundry and her husband fixes her meals.Patient suffers from small cell lung cancer with mets to liver and bonewhich impairs their ability to perform daily activities like toileting, ADLs in the home.  A walker wiill not resolve  issue with performing activities of daily living. A wheelchair will allow patient to safely perform daily activities.  Lower extremity weakness  Accessories: elevating leg rests (ELRs), wheel locks, extensions and anti-tippers. Seat and back cushion  Will order home health and wheelchair to assist pt with toileting and other ADLs. Order sent to [facility].

## 2018-05-27 NOTE — Telephone Encounter (Signed)
Patient left the office today after getting her labs drawn. She supposed to have office visit after lab appointment at the patient's request. Her [place] continues to be low today. She recently had a neulasta injection a few days prior. Left a voicemail for patient reminding her of neutropenic precautions and to monitor herself for signs and symptoms of an infection. She was provided a number for our office should she have any questions.

## 2018-05-28 ENCOUNTER — Other Ambulatory Visit: Payer: Self-pay | Admitting: *Deleted

## 2018-05-28 DIAGNOSIS — C3491 Malignant neoplasm of unspecified part of right bronchus or lung: Secondary | ICD-10-CM

## 2018-05-31 ENCOUNTER — Other Ambulatory Visit: Payer: Self-pay

## 2018-05-31 ENCOUNTER — Inpatient Hospital Stay: Payer: Medicare Other

## 2018-05-31 DIAGNOSIS — C3491 Malignant neoplasm of unspecified part of right bronchus or lung: Secondary | ICD-10-CM

## 2018-05-31 DIAGNOSIS — Z5189 Encounter for other specified aftercare: Secondary | ICD-10-CM | POA: Diagnosis not present

## 2018-05-31 LAB — CBC WITH DIFFERENTIAL (CANCER CENTER ONLY)
Abs Immature Granulocytes: 1.45 10*3/uL — ABNORMAL HIGH (ref 0.00–0.07)
Basophils Absolute: 0 10*3/uL (ref 0.0–0.1)
Basophils Relative: 0 %
Eosinophils Absolute: 0 10*3/uL (ref 0.0–0.5)
Eosinophils Relative: 0 %
HCT: 34.9 % — ABNORMAL LOW (ref 36.0–46.0)
Hemoglobin: 10.3 g/dL — ABNORMAL LOW (ref 12.0–15.0)
IMMATURE GRANULOCYTES: 8 %
LYMPHS ABS: 1.3 10*3/uL (ref 0.7–4.0)
Lymphocytes Relative: 7 %
MCH: 30.8 pg (ref 26.0–34.0)
MCHC: 29.5 g/dL — ABNORMAL LOW (ref 30.0–36.0)
MCV: 104.5 fL — ABNORMAL HIGH (ref 80.0–100.0)
Monocytes Absolute: 0.9 10*3/uL (ref 0.1–1.0)
Monocytes Relative: 5 %
Neutro Abs: 13.8 10*3/uL — ABNORMAL HIGH (ref 1.7–7.7)
Neutrophils Relative %: 80 %
Platelet Count: 191 10*3/uL (ref 150–400)
RBC: 3.34 MIL/uL — ABNORMAL LOW (ref 3.87–5.11)
RDW: 14.4 % (ref 11.5–15.5)
WBC Count: 17.5 10*3/uL — ABNORMAL HIGH (ref 4.0–10.5)
nRBC: 0.1 % (ref 0.0–0.2)

## 2018-06-04 ENCOUNTER — Other Ambulatory Visit: Payer: Self-pay | Admitting: *Deleted

## 2018-06-07 ENCOUNTER — Other Ambulatory Visit: Payer: Self-pay

## 2018-06-07 ENCOUNTER — Inpatient Hospital Stay: Payer: Medicare Other | Attending: Internal Medicine | Admitting: Physician Assistant

## 2018-06-07 ENCOUNTER — Inpatient Hospital Stay: Payer: Medicare Other

## 2018-06-07 VITALS — BP 129/77 | HR 103 | Temp 98.4°F | Resp 18 | Ht 64.0 in | Wt 141.0 lb

## 2018-06-07 VITALS — HR 86

## 2018-06-07 DIAGNOSIS — C3491 Malignant neoplasm of unspecified part of right bronchus or lung: Secondary | ICD-10-CM

## 2018-06-07 DIAGNOSIS — Z5189 Encounter for other specified aftercare: Secondary | ICD-10-CM | POA: Diagnosis present

## 2018-06-07 DIAGNOSIS — Z5112 Encounter for antineoplastic immunotherapy: Secondary | ICD-10-CM | POA: Diagnosis present

## 2018-06-07 DIAGNOSIS — C342 Malignant neoplasm of middle lobe, bronchus or lung: Secondary | ICD-10-CM | POA: Diagnosis not present

## 2018-06-07 DIAGNOSIS — Z7951 Long term (current) use of inhaled steroids: Secondary | ICD-10-CM | POA: Diagnosis not present

## 2018-06-07 DIAGNOSIS — R5383 Other fatigue: Secondary | ICD-10-CM | POA: Diagnosis not present

## 2018-06-07 DIAGNOSIS — Z79899 Other long term (current) drug therapy: Secondary | ICD-10-CM | POA: Insufficient documentation

## 2018-06-07 DIAGNOSIS — C7951 Secondary malignant neoplasm of bone: Secondary | ICD-10-CM | POA: Diagnosis not present

## 2018-06-07 DIAGNOSIS — C787 Secondary malignant neoplasm of liver and intrahepatic bile duct: Secondary | ICD-10-CM | POA: Insufficient documentation

## 2018-06-07 DIAGNOSIS — Z5111 Encounter for antineoplastic chemotherapy: Secondary | ICD-10-CM | POA: Diagnosis present

## 2018-06-07 DIAGNOSIS — R531 Weakness: Secondary | ICD-10-CM

## 2018-06-07 DIAGNOSIS — K219 Gastro-esophageal reflux disease without esophagitis: Secondary | ICD-10-CM | POA: Diagnosis not present

## 2018-06-07 DIAGNOSIS — J449 Chronic obstructive pulmonary disease, unspecified: Secondary | ICD-10-CM | POA: Diagnosis not present

## 2018-06-07 LAB — CMP (CANCER CENTER ONLY)
ALT: 12 U/L (ref 0–44)
AST: 18 U/L (ref 15–41)
Albumin: 2.8 g/dL — ABNORMAL LOW (ref 3.5–5.0)
Alkaline Phosphatase: 178 U/L — ABNORMAL HIGH (ref 38–126)
Anion gap: 11 (ref 5–15)
BUN: 10 mg/dL (ref 8–23)
CO2: 25 mmol/L (ref 22–32)
Calcium: 8.5 mg/dL — ABNORMAL LOW (ref 8.9–10.3)
Chloride: 106 mmol/L (ref 98–111)
Creatinine: 0.74 mg/dL (ref 0.44–1.00)
GFR, Est AFR Am: >60 mL/min (ref >60)
GFR, Estimated: >60 mL/min (ref >60)
Glucose, Bld: 88 mg/dL (ref 70–99)
Potassium: 3.6 mmol/L (ref 3.5–5.1)
Sodium: 142 mmol/L (ref 135–145)
Total Bilirubin: 0.2 mg/dL — ABNORMAL LOW (ref 0.3–1.2)
Total Protein: 5.7 g/dL — ABNORMAL LOW (ref 6.5–8.1)

## 2018-06-07 LAB — CBC WITH DIFFERENTIAL (CANCER CENTER ONLY)
Abs Immature Granulocytes: 2.12 10*3/uL — ABNORMAL HIGH (ref 0.00–0.07)
Basophils Absolute: 0.1 10*3/uL (ref 0.0–0.1)
Basophils Relative: 1 %
Eosinophils Absolute: 0 10*3/uL (ref 0.0–0.5)
Eosinophils Relative: 0 %
HCT: 33.8 % — ABNORMAL LOW (ref 36.0–46.0)
Hemoglobin: 10.1 g/dL — ABNORMAL LOW (ref 12.0–15.0)
Immature Granulocytes: 9 %
Lymphocytes Relative: 11 %
Lymphs Abs: 2.4 10*3/uL (ref 0.7–4.0)
MCH: 31.1 pg (ref 26.0–34.0)
MCHC: 29.9 g/dL — ABNORMAL LOW (ref 30.0–36.0)
MCV: 104 fL — ABNORMAL HIGH (ref 80.0–100.0)
Monocytes Absolute: 1.4 10*3/uL — ABNORMAL HIGH (ref 0.1–1.0)
Monocytes Relative: 6 %
Neutro Abs: 16.6 10*3/uL — ABNORMAL HIGH (ref 1.7–7.7)
Neutrophils Relative %: 73 %
Platelet Count: 593 10*3/uL — ABNORMAL HIGH (ref 150–400)
RBC: 3.25 MIL/uL — ABNORMAL LOW (ref 3.87–5.11)
RDW: 15.5 % (ref 11.5–15.5)
WBC Count: 22.6 10*3/uL — ABNORMAL HIGH (ref 4.0–10.5)
nRBC: 0.1 % (ref 0.0–0.2)

## 2018-06-07 LAB — TSH: TSH: 0.647 u[IU]/mL (ref 0.308–3.960)

## 2018-06-07 MED ORDER — SODIUM CHLORIDE 0.9 % IV SOLN
290.0000 mg | Freq: Once | INTRAVENOUS | Status: AC
  Administered 2018-06-07: 290 mg via INTRAVENOUS
  Filled 2018-06-07: qty 29

## 2018-06-07 MED ORDER — SODIUM CHLORIDE 0.9 % IV SOLN
80.0000 mg/m2 | Freq: Once | INTRAVENOUS | Status: AC
  Administered 2018-06-07: 140 mg via INTRAVENOUS
  Filled 2018-06-07: qty 7

## 2018-06-07 MED ORDER — SODIUM CHLORIDE 0.9% FLUSH
10.0000 mL | INTRAVENOUS | Status: DC | PRN
  Administered 2018-06-07: 10 mL
  Filled 2018-06-07: qty 10

## 2018-06-07 MED ORDER — SODIUM CHLORIDE 0.9 % IV SOLN
Freq: Once | INTRAVENOUS | Status: AC
  Administered 2018-06-07: 11:00:00 via INTRAVENOUS
  Filled 2018-06-07: qty 250

## 2018-06-07 MED ORDER — SODIUM CHLORIDE 0.9 % IV SOLN
Freq: Once | INTRAVENOUS | Status: AC
  Administered 2018-06-07: 12:00:00 via INTRAVENOUS
  Filled 2018-06-07: qty 5

## 2018-06-07 MED ORDER — PALONOSETRON HCL INJECTION 0.25 MG/5ML
INTRAVENOUS | Status: AC
  Filled 2018-06-07: qty 5

## 2018-06-07 MED ORDER — HEPARIN SOD (PORK) LOCK FLUSH 100 UNIT/ML IV SOLN
500.0000 [IU] | Freq: Once | INTRAVENOUS | Status: AC | PRN
  Administered 2018-06-07: 500 [IU]
  Filled 2018-06-07: qty 5

## 2018-06-07 MED ORDER — PALONOSETRON HCL INJECTION 0.25 MG/5ML
0.2500 mg | Freq: Once | INTRAVENOUS | Status: AC
  Administered 2018-06-07: 0.25 mg via INTRAVENOUS

## 2018-06-07 MED ORDER — SODIUM CHLORIDE 0.9 % IV SOLN
1200.0000 mg | Freq: Once | INTRAVENOUS | Status: AC
  Administered 2018-06-07: 1200 mg via INTRAVENOUS
  Filled 2018-06-07: qty 20

## 2018-06-07 NOTE — Progress Notes (Signed)
[facility] OFFICE PROGRESS NOTE  Sinda Du, MD [address] [place] [address]  DIAGNOSIS: Extensive stage (T2b, N3, M1c) small cell lung cancer presented with right middle lobe lung mass in addition to right hilar and mediastinal lymphadenopathy as well as metastatic disease to the bone and liver diagnosed in February 2020.   PRIOR THERAPY: None   CURRENT THERAPY: Dose reduced carboplatin for AUC of 4 on day 1, etoposide 80 mg/M2 on days 1, 2 and 3 with Tecentriq 1200 mg IV every 3 weeks with Neulasta support. First dose 05/17/2018. Status post 1 cycle.   INTERVAL HISTORY: [name] 78 y.o. female returns to the clinic for a follow-up visit.  She received her first treatment 3 weeks ago and tolerated it fair except for fatigue/weakness for approximately 2 weeks following treatment. She also experienced neutropenia following treatment without any evidence of infection or fevers. During her first treatment, she was found to have erythema over her IV site which was concerning for extravasion. She was seen in the symptom management clinic 24 and 48 hours later. She did not have any evidence of erythema, skin breakdown, exudate, or vesicles at that time. Today, her arm still shows some mild bruising but no other skin changes. She also recently had a port placed and tolerated the procedure well without any complications.   Today, she feels back to her baseline.  She denies any fever, chills, night sweats, or weight loss.  She denies any chest pain, or hemoptysis.  She endorses her usual dyspnea on exertion and cough. She is on 2L of home oxygen. She denies any nausea, vomiting, diarrhea, or constipation.  She denies any headache or visual changes. She is here for evaluation prior to starting cycle #2 of her treatment.  MEDICAL HISTORY: Past Medical History:  Diagnosis Date  . COPD (chronic obstructive pulmonary disease) ([place])   . Dyspnea   . GERD  (gastroesophageal reflux disease)   . On home oxygen therapy   . Tachycardia     ALLERGIES:  is allergic to morphine; oxycodone-acetaminophen; celecoxib; ciprofloxacin; codeine; iodine; levaquin  [levofloxacin in d5w]; lisinopril; losartan; midazolam; nsaids; penicillins; statins; atorvastatin; citalopram; colesevelam; fenofibrate; tramadol hcl; aleve [naproxen sodium]; azithromycin; cyclobenzaprine; erythromycin; fosamax [alendronate]; meloxicam; moxifloxacin; and sulfonamide derivatives.  MEDICATIONS:  Current Outpatient Medications  Medication Sig Dispense Refill  . acetaminophen (TYLENOL) 325 MG tablet Take by mouth. arthritis    . albuterol (PROVENTIL HFA;VENTOLIN HFA) 108 (90 Base) MCG/ACT inhaler Inhale into the lungs every 6 (six) hours as needed for wheezing or shortness of breath.    . Azelastine HCl 0.15 % SOLN 1-2 sprays each nostril 2 times per day as needed. 30 mL 5  . budesonide-formoterol (SYMBICORT) 160-4.5 MCG/ACT inhaler Inhale 2 puffs into the lungs 2 (two) times daily.    . carvedilol (COREG) 6.25 MG tablet Take 1 tablet (6.25 mg total) by mouth 2 (two) times daily with a meal. 180 tablet 3  . diphenhydrAMINE (BENADRYL) 25 MG tablet Take by mouth. As needed for allergies    . gabapentin (NEURONTIN) 300 MG capsule Take 300 mg by mouth 2 (two) times daily.   2  . ipratropium (ATROVENT) 0.02 % nebulizer solution Take 0.5 mg by nebulization 4 (four) times daily as needed for wheezing or shortness of breath.    . lidocaine-prilocaine (EMLA) cream Apply 1 application topically as needed. Apply on skin over port site 1-2 hours prior to treatment.  Cove with plastic wrap. (Patient not taking:  Reported on 05/21/2018) 30 g 0  . losartan (COZAAR) 25 MG tablet Take 1 tablet (25 mg total) by mouth daily. 90 tablet 3  . omeprazole (PRILOSEC) 20 MG capsule Take by mouth.    . OXYGEN Inhale 2-3 L into the lungs.    . pravastatin (PRAVACHOL) 80 MG tablet TAKE ONE TABLET BY MOUTH ONCE DAILY  IN THE EVENING    . predniSONE (DELTASONE) 10 MG tablet Take 10 mg by mouth daily.    . predniSONE (DELTASONE) 5 MG tablet 6 tab x 1 day, 5 tab x 1 day, 4 tab x 1 day, 3 tab x 1 day, 2 tab x 1 day, 1 tab x 1 day, stop 21 tablet 0  . prochlorperazine (COMPAZINE) 10 MG tablet Take 1 tablet (10 mg total) by mouth every 6 (six) hours as needed for nausea or vomiting. 30 tablet 0  . torsemide (DEMADEX) 20 MG tablet TAKE 20 MG DAILY MAY TAKE ADDITIONAL 20 MG AS NEEDED FOR SWELLING 180 tablet 1  . triamcinolone (NASACORT) 55 MCG/ACT AERO nasal inhaler Place into the nose.     No current facility-administered medications for this visit.    Facility-Administered Medications Ordered in Other Visits  Medication Dose Route Frequency Provider Last Rate Last Dose  . atezolizumab (TECENTRIQ) 1,200 mg in sodium chloride 0.9 % 250 mL chemo infusion  1,200 mg Intravenous Once Curt Bears, MD      . CARBOplatin (PARAPLATIN) 290 mg in sodium chloride 0.9 % 250 mL chemo infusion  290 mg Intravenous Once Curt Bears, MD      . etoposide (VEPESID) 140 mg in sodium chloride 0.9 % 500 mL chemo infusion  80 mg/m2 (Treatment Plan Recorded) Intravenous Once Curt Bears, MD      . heparin lock flush 100 unit/mL  500 Units Intracatheter Once PRN Curt Bears, MD      . sodium chloride flush (NS) 0.9 % injection 10 mL  10 mL Intracatheter PRN Curt Bears, MD        SURGICAL HISTORY:  Past Surgical History:  Procedure Laterality Date  . APPENDECTOMY    . arthroscopc     total knee  . BACK SURGERY    . FOOT SURGERY    . IR IMAGING GUIDED PORT INSERTION  05/25/2018  . knee surgery bilateral    . PARTIAL HYSTERECTOMY    . ruptured disk      REVIEW OF SYSTEMS:   Review of Systems  Constitutional: Positive for generalized weakness and fatigue. Negative for appetite change, chills, fever and unexpected weight change.  HENT:   Negative for mouth sores, nosebleeds, sore throat and trouble  swallowing.   Eyes: Negative for eye problems and icterus.  Respiratory: Positive for baseline cough and dyspnea on exertion. Negative for hemoptysis and  wheezing.   Cardiovascular: Positive for (improved) bilateral leg swelling. Negative for chest pain.   Gastrointestinal: Negative for abdominal pain, constipation, diarrhea, nausea and vomiting.  Genitourinary: Negative for bladder incontinence, difficulty urinating, dysuria, frequency and hematuria.   Musculoskeletal: Positive for chronic back pain. Negative for gait problem, neck pain and neck stiffness.  Skin: Negative for itching and rash.  Neurological: Negative for dizziness, extremity weakness, gait problem, headaches, light-headedness and seizures.  Hematological: Negative for adenopathy. Does not bruise/bleed easily.  Psychiatric/Behavioral: Negative for confusion, depression and sleep disturbance. The patient is not nervous/anxious.     PHYSICAL EXAMINATION:  Blood pressure 129/77, pulse (!) 103, temperature 98.4 F (36.9 C), temperature source Oral, resp.  rate 18, height 5\' 4"  (1.626 m), weight 141 lb (64 kg), SpO2 98 %.  ECOG PERFORMANCE STATUS: 2 - Symptomatic, <50% confined to bed  Physical Exam  Constitutional: Oriented to person, place, and time and well-developed, well-nourished, and in no distress. Patient was in the wheelchair during examination.  HENT:  Head: Normocephalic and atraumatic.  Mouth/Throat: Oropharynx is clear and moist. No oropharyngeal exudate.  Eyes: Conjunctivae are normal. Right eye exhibits no discharge. Left eye exhibits no discharge. No scleral icterus.  Neck: Normal range of motion. Neck supple.  Cardiovascular: Normal rate, regular rhythm, normal heart sounds and intact distal pulses.   Pulmonary/Chest: Effort normal. Bibasilar wheezing present. No respiratory distress. . No rales.  Abdominal: Soft. Bowel sounds are normal. Exhibits no distension and no mass. There is no tenderness.   Musculoskeletal: Pitting edema in the lower extremities. Normal range of motion.   Lymphadenopathy:    No cervical adenopathy.  Neurological: Alert and oriented to person, place, and time. Exhibits normal muscle tone. Gait normal. Coordination normal.  Skin: Bruising over the dorsal part of the left forearm. Skin is warm and dry. No rash noted. Not diaphoretic. No erythema. No pallor.  Psychiatric: Mood, memory and judgment normal.  Vitals reviewed.  LABORATORY DATA: Lab Results  Component Value Date   WBC 22.6 (H) 06/07/2018   HGB 10.1 (L) 06/07/2018   HCT 33.8 (L) 06/07/2018   MCV 104.0 (H) 06/07/2018   PLT 593 (H) 06/07/2018      Chemistry      Component Value Date/Time   NA 142 06/07/2018 0934   K 3.6 06/07/2018 0934   CL 106 06/07/2018 0934   CO2 25 06/07/2018 0934   BUN 10 06/07/2018 0934   CREATININE 0.74 06/07/2018 0934      Component Value Date/Time   CALCIUM 8.5 (L) 06/07/2018 0934   ALKPHOS 178 (H) 06/07/2018 0934   AST 18 06/07/2018 0934   ALT 12 06/07/2018 0934   BILITOT 0.2 (L) 06/07/2018 0934       RADIOGRAPHIC STUDIES:  Mr Jeri Cos FA Contrast  Result Date: 05/19/2018 CLINICAL DATA:  Small cell lung cancer.  Restaging. EXAM: MRI HEAD WITHOUT AND WITH CONTRAST TECHNIQUE: Multiplanar, multiecho pulse sequences of the brain and surrounding structures were obtained without and with intravenous contrast. CONTRAST:  6 cc Gadavist COMPARISON:  None. FINDINGS: Brain: No sign of old or current metastatic disease. Diffusion imaging shows a punctate focus of restricted diffusion within the right parietal subcortical white matter likely to represent a recent small vessel infarction. Elsewhere, there chronic small-vessel ischemic changes of the pons, cerebellum and cerebral hemispheric white matter. No cortical or large vessel territory infarction. No hemorrhage, hydrocephalus or extra-axial collection. No abnormal contrast enhancement in the region. Vascular: Major  vessels at the base of the brain show flow. Skull and upper cervical spine: Negative Sinuses/Orbits: Clear/normal Other: None IMPRESSION: No evidence of active or treated metastatic disease. 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 Likely incidental punctate acute or subacute white matter infarction in the right parietal subcortical white matter. Electronically Signed   By: Nelson Chimes M.D.   On: 05/19/2018 16:14   Ir Imaging Guided Port Insertion  Result Date: 05/25/2018 INDICATION: 78 year old with small cell lung cancer. Patient has poor venous access and needs port for treatment. EXAM: FLUOROSCOPIC AND ULTRASOUND GUIDED PLACEMENT OF A SUBCUTANEOUS PORT COMPARISON:  None. MEDICATIONS: Make a mice in 1 g; The antibiotic was administered within an appropriate time interval prior to skin  puncture. ANESTHESIA/SEDATION: Ativan 1.0 mg IV; Fentanyl 100 mcg IV; Moderate Sedation Time:  34 minutes The patient was continuously monitored during the procedure by the interventional radiology nurse under my direct supervision. FLUOROSCOPY TIME:  18 seconds, 3 mGy COMPLICATIONS: None immediate. PROCEDURE: The procedure, risks, benefits, and alternatives were explained to the patient. Questions regarding the procedure were encouraged and answered. The patient understands and consents to the procedure. Patient was placed supine on the interventional table. Ultrasound confirmed a patent right internal jugular vein. Ultrasound image was saved for documentation. The right chest and neck were cleaned with a skin antiseptic and a sterile drape was placed. Maximal barrier sterile technique was utilized including caps, mask, sterile gowns, sterile gloves, sterile drape, hand hygiene and skin antiseptic. The right neck was anesthetized with 1% lidocaine. Small incision was made in the right neck with a blade. Micropuncture set was placed in the right internal jugular vein with ultrasound guidance. The  micropuncture wire was used for measurement purposes. The right chest was anesthetized with 1% lidocaine with epinephrine. #15 blade was used to make an incision and a subcutaneous port pocket was formed. [place] was assembled. Subcutaneous tunnel was formed with a stiff tunneling device. The port catheter was brought through the subcutaneous tunnel. The port was placed in the subcutaneous pocket. The micropuncture set was exchanged for a peel-away sheath. The catheter was placed through the peel-away sheath and the tip was positioned at the SVC/right atrium junction. Catheter placement was confirmed with fluoroscopy. The port was accessed and flushed with heparinized saline. The port pocket was closed using two layers of absorbable sutures and Dermabond. The vein skin site was closed using a single layer of absorbable suture and Dermabond. Sterile dressings were applied. Patient tolerated the procedure well without an immediate complication. Ultrasound and fluoroscopic images were taken and saved for this procedure. IMPRESSION: Placement of a subcutaneous port device. Catheter tip at the SVC/right atrium junction. Electronically Signed   By: Markus Daft M.D.   On: 05/25/2018 17:05     ASSESSMENT/PLAN:  This is a very pleasant 78 year old white female recently diagnosed with extensive stage (T2b, N3, M1c) small cell lung cancer presented with right middle lobe lung mass in addition to right hilar and mediastinal lymphadenopathy as well as metastatic disease to the bone and liver diagnosed in February 2020.   She is currently undergoing treatment with carboplatin for an AUC of 4 on day 1, etoposide 80 mg/m on days 1, 2, and 3 with Tecentriq 120 mg IV every 3 weeks with Neulasta support. She is on a reduced dose due to her poor general status and risk for infection during this time.  She is status post 1 cycle. She tolerated treatment fair except for generalized weakness and neutropenia which  improved with neulasta.  The patient was seen with Dr. Julien Nordmann today. The labs were reviewed with the patient. The patient received Neulasta a few weeks ago and her WBCs are 22.6 today.  We will continue to monitor her labs weekly.  We recommend the patient proceed with cycle #2 today as scheduled.  I will arrange for a restaging CT scan to be performed prior to her next appointment.   We will see the patient back in 3 weeks for evaluation and to discuss her scan results prior to starting cycle #3.  Neutropenic precautions were reviewed with the patient today.  The patient knows to contact our office should she develop any concerning signs  symptoms of infection.   The patient was advised to call immediately if she has any concerning symptoms in the interval. The patient voices understanding of current disease status and treatment options and is in agreement with the current care plan. All questions were answered. The patient knows to call the clinic with any problems, questions or concerns. We can certainly see the patient much sooner if necessary   Orders Placed This Encounter  Procedures  . CT Abdomen Wo Contrast    Standing Status:   Future    Standing Expiration Date:   06/07/2019    Order Specific Question:   ** REASON FOR EXAM (FREE TEXT)    Answer:   Restaging Lung Cancer    Order Specific Question:   Preferred imaging location?    Answer:   [facility]    Order Specific Question:   Is Oral Contrast requested for this exam?    Answer:   Yes, Per Radiology protocol    Order Specific Question:   Radiology Contrast Protocol - do NOT remove file path    Answer:   \\[email or web address]  . CT Chest Wo Contrast    Standing Status:   Future    Standing Expiration Date:   06/07/2019    Order Specific Question:   ** REASON FOR EXAM (FREE TEXT)    Answer:   Restating Lung Cancer    Order Specific Question:   Preferred imaging location?    Answer:   [facility]    Order Specific Question:   Radiology Contrast Protocol - do NOT remove file path    Answer:   \\[email or web address]      L , PA-C 06/07/18  ADDENDUM: Hematology/Oncology Attending: I had a face-to-face encounter with the patient.  I recommended her care plan.  This is a very pleasant 78 years old white female with extensive stage small cell lung cancer and currently undergoing systemic chemotherapy with reduced dose carboplatin, etoposide and Tecentriq status post 1 cycle.  She tolerated the first cycle of her treatment well except for fatigue.  The patient is feeling much better today.  She denied having any significant fever or chills.  She has no nausea, vomiting, diarrhea or constipation. I recommended for her to proceed with cycle #2 of her treatment today. We will see her back for follow-up visit in 3 weeks for evaluation after repeating CT scan of the chest, abdomen and pelvis for restaging of her disease. The patient was advised to call immediately if she has any concerning symptoms in the interval. Disclaimer: This note was dictated with voice recognition software. Similar sounding words can inadvertently be transcribed and may be missed upon review. Eilleen Kempf, MD 06/08/18

## 2018-06-07 NOTE — Patient Instructions (Signed)
Coronavirus (COVID-19) Are you at risk?  Are you at risk for the Coronavirus (COVID-19)?  To be considered HIGH RISK for Coronavirus (COVID-19), you have to meet the following criteria:  . Traveled to [place], [place], [place], [place] or [place]; or in the [place] to [place], [place], [place], or [place]; and have fever, cough, and shortness of breath within the last 2 weeks of travel OR . Been in close contact with a person diagnosed with COVID-19 within the last 2 weeks and have fever, cough, and shortness of breath . IF YOU DO NOT MEET THESE CRITERIA, YOU ARE CONSIDERED LOW RISK FOR COVID-19.  What to do if you are HIGH RISK for COVID-19?  . If you are having a medical emergency, call 911. . Seek medical care right away. Before you go to a doctor's office, urgent care or emergency department, call ahead and tell them about your recent travel, contact with someone diagnosed with COVID-19, and your symptoms. You should receive instructions from your physician's office regarding next steps of care.  . When you arrive at healthcare provider, tell the healthcare staff immediately you have returned from visiting [place], [place], [place], [place] or [place]; or traveled in the [place] to [place], [place], [place], or [place]; in the last two weeks or you have been in close contact with a person diagnosed with COVID-19 in the last 2 weeks.   . Tell the health care staff about your symptoms: fever, cough and shortness of breath. . After you have been seen by a medical provider, you will be either: o Tested for (COVID-19) and discharged home on quarantine except to seek medical care if symptoms worsen, and asked to  - Stay home and avoid contact with others until you get your results (4-5 days)  - Avoid travel on public transportation if possible (such as bus, train, or airplane) or o Sent to the Emergency Department by EMS for evaluation, COVID-19 testing, and possible  admission depending on your condition and test results.  What to do if you are LOW RISK for COVID-19?  Reduce your risk of any infection by using the same precautions used for avoiding the common cold or flu:  . Wash your hands often with soap and warm water for at least 20 seconds.  If soap and water are not readily available, use an alcohol-based hand sanitizer with at least 60% alcohol.  . If coughing or sneezing, cover your mouth and nose by coughing or sneezing into the elbow areas of your shirt or coat, into a tissue or into your sleeve (not your hands). . Avoid shaking hands with others and consider head nods or verbal greetings only. . Avoid touching your eyes, nose, or mouth with unwashed hands.  . Avoid close contact with people who are sick. . Avoid places or events with large numbers of people in one location, like concerts or sporting events. . Carefully consider travel plans you have or are making. . If you are planning any travel outside or inside the [place], visit the CDC's Travelers' Health webpage for the latest health notices. . If you have some symptoms but not all symptoms, continue to monitor at home and seek medical attention if your symptoms worsen. . If you are having a medical emergency, call 911.   ADDITIONAL HEALTHCARE OPTIONS FOR PATIENTS  [facility] / e-Visit: [email or web address]         [facility]: [phone]  [facility]: [facility]: [facility] Discharge Instructions for Patients Receiving Chemotherapy  Today you received the following chemotherapy agents :  Tecentriq,  Carboplatin,  Etoposide.  To help prevent nausea and vomiting after your treatment, we encourage you to take your nausea medication as prescribed.   If you develop nausea and vomiting that is not controlled by your nausea  medication, call the clinic.   BELOW ARE SYMPTOMS THAT SHOULD BE REPORTED IMMEDIATELY:  *FEVER GREATER THAN 100.5 F  *CHILLS WITH OR WITHOUT FEVER  NAUSEA AND VOMITING THAT IS NOT CONTROLLED WITH YOUR NAUSEA MEDICATION  *UNUSUAL SHORTNESS OF BREATH  *UNUSUAL BRUISING OR BLEEDING  TENDERNESS IN MOUTH AND THROAT WITH OR WITHOUT PRESENCE OF ULCERS  *URINARY PROBLEMS  *BOWEL PROBLEMS  UNUSUAL RASH Items with * indicate a potential emergency and should be followed up as soon as possible.  Feel free to call the clinic should you have any questions or concerns. The clinic phone number is [phone].  Please show the [facility] at check-in to the Emergency Department and triage nurse.

## 2018-06-07 NOTE — Progress Notes (Signed)
Confirmed chemo doses w/ Cassie/Dr. Julien Nordmann - keep carboplatin at AUC = 4 and etoposide at 80 mg/m2 for cycle 2.   Demetrius Charity, PharmD, [facility] Pharmacist [facility] Phone: [phone] 06/07/2018

## 2018-06-08 ENCOUNTER — Other Ambulatory Visit: Payer: Self-pay

## 2018-06-08 ENCOUNTER — Inpatient Hospital Stay: Payer: Medicare Other

## 2018-06-08 ENCOUNTER — Encounter: Payer: Self-pay | Admitting: Physician Assistant

## 2018-06-08 VITALS — BP 126/85 | HR 86 | Temp 98.6°F | Resp 18

## 2018-06-08 DIAGNOSIS — C3491 Malignant neoplasm of unspecified part of right bronchus or lung: Secondary | ICD-10-CM

## 2018-06-08 DIAGNOSIS — Z5189 Encounter for other specified aftercare: Secondary | ICD-10-CM | POA: Diagnosis not present

## 2018-06-08 MED ORDER — SODIUM CHLORIDE 0.9 % IV SOLN
80.0000 mg/m2 | Freq: Once | INTRAVENOUS | Status: AC
  Administered 2018-06-08: 140 mg via INTRAVENOUS
  Filled 2018-06-08: qty 7

## 2018-06-08 MED ORDER — SODIUM CHLORIDE 0.9% FLUSH
10.0000 mL | INTRAVENOUS | Status: DC | PRN
  Administered 2018-06-08: 10 mL
  Filled 2018-06-08: qty 10

## 2018-06-08 MED ORDER — HEPARIN SOD (PORK) LOCK FLUSH 100 UNIT/ML IV SOLN
500.0000 [IU] | Freq: Once | INTRAVENOUS | Status: AC | PRN
  Administered 2018-06-08: 500 [IU]
  Filled 2018-06-08: qty 5

## 2018-06-08 MED ORDER — DEXAMETHASONE SODIUM PHOSPHATE 10 MG/ML IJ SOLN
10.0000 mg | Freq: Once | INTRAMUSCULAR | Status: AC
  Administered 2018-06-08: 10 mg via INTRAVENOUS

## 2018-06-08 MED ORDER — DEXAMETHASONE SODIUM PHOSPHATE 10 MG/ML IJ SOLN
INTRAMUSCULAR | Status: AC
  Filled 2018-06-08: qty 1

## 2018-06-08 MED ORDER — SODIUM CHLORIDE 0.9 % IV SOLN
Freq: Once | INTRAVENOUS | Status: AC
  Administered 2018-06-08: 14:00:00 via INTRAVENOUS
  Filled 2018-06-08: qty 250

## 2018-06-08 NOTE — Patient Instructions (Signed)
[facility] Discharge Instructions for Patients Receiving Chemotherapy  Today you received the following chemotherapy agents: Etoposide.  To help prevent nausea and vomiting after your treatment, we encourage you to take your nausea medication as directed.  If you develop nausea and vomiting that is not controlled by your nausea medication, call the clinic.   BELOW ARE SYMPTOMS THAT SHOULD BE REPORTED IMMEDIATELY:  *FEVER GREATER THAN 100.5 F  *CHILLS WITH OR WITHOUT FEVER  NAUSEA AND VOMITING THAT IS NOT CONTROLLED WITH YOUR NAUSEA MEDICATION  *UNUSUAL SHORTNESS OF BREATH  *UNUSUAL BRUISING OR BLEEDING  TENDERNESS IN MOUTH AND THROAT WITH OR WITHOUT PRESENCE OF ULCERS  *URINARY PROBLEMS  *BOWEL PROBLEMS  UNUSUAL RASH Items with * indicate a potential emergency and should be followed up as soon as possible.  Feel free to call the clinic should you have any questions or concerns. The clinic phone number is [phone].  Please show the [facility] at check-in to the Emergency Department and triage nurse.  This information is directly available on the CDC website: RunningShows.co.za.html    Source:CDC Reference to specific commercial products, manufacturers, companies, or trademarks does not constitute its endorsement or recommendation by the [facility], [facility], or Centers for [employer] and [employer].

## 2018-06-09 ENCOUNTER — Other Ambulatory Visit: Payer: Self-pay

## 2018-06-09 ENCOUNTER — Inpatient Hospital Stay: Payer: Medicare Other

## 2018-06-09 VITALS — BP 174/92 | HR 76 | Temp 97.9°F | Resp 18

## 2018-06-09 DIAGNOSIS — C3491 Malignant neoplasm of unspecified part of right bronchus or lung: Secondary | ICD-10-CM

## 2018-06-09 DIAGNOSIS — Z5189 Encounter for other specified aftercare: Secondary | ICD-10-CM | POA: Diagnosis not present

## 2018-06-09 MED ORDER — DEXAMETHASONE SODIUM PHOSPHATE 10 MG/ML IJ SOLN
INTRAMUSCULAR | Status: AC
  Filled 2018-06-09: qty 1

## 2018-06-09 MED ORDER — SODIUM CHLORIDE 0.9 % IV SOLN
Freq: Once | INTRAVENOUS | Status: AC
  Administered 2018-06-09: 15:00:00 via INTRAVENOUS
  Filled 2018-06-09: qty 250

## 2018-06-09 MED ORDER — SODIUM CHLORIDE 0.9 % IV SOLN
80.0000 mg/m2 | Freq: Once | INTRAVENOUS | Status: AC
  Administered 2018-06-09: 140 mg via INTRAVENOUS
  Filled 2018-06-09: qty 7

## 2018-06-09 MED ORDER — SODIUM CHLORIDE 0.9% FLUSH
10.0000 mL | INTRAVENOUS | Status: DC | PRN
  Administered 2018-06-09: 17:00:00 10 mL
  Filled 2018-06-09: qty 10

## 2018-06-09 MED ORDER — DEXAMETHASONE SODIUM PHOSPHATE 10 MG/ML IJ SOLN
10.0000 mg | Freq: Once | INTRAMUSCULAR | Status: AC
  Administered 2018-06-09: 15:00:00 10 mg via INTRAVENOUS

## 2018-06-09 MED ORDER — HEPARIN SOD (PORK) LOCK FLUSH 100 UNIT/ML IV SOLN
500.0000 [IU] | Freq: Once | INTRAVENOUS | Status: AC | PRN
  Administered 2018-06-09: 500 [IU]
  Filled 2018-06-09: qty 5

## 2018-06-11 ENCOUNTER — Inpatient Hospital Stay: Payer: Medicare Other

## 2018-06-11 ENCOUNTER — Other Ambulatory Visit: Payer: Self-pay

## 2018-06-11 VITALS — BP 121/75 | HR 87 | Temp 99.2°F | Resp 18

## 2018-06-11 DIAGNOSIS — C3491 Malignant neoplasm of unspecified part of right bronchus or lung: Secondary | ICD-10-CM

## 2018-06-11 DIAGNOSIS — Z5189 Encounter for other specified aftercare: Secondary | ICD-10-CM | POA: Diagnosis not present

## 2018-06-11 MED ORDER — PEGFILGRASTIM-CBQV 6 MG/0.6ML ~~LOC~~ SOSY
6.0000 mg | PREFILLED_SYRINGE | Freq: Once | SUBCUTANEOUS | Status: AC
  Administered 2018-06-11: 6 mg via SUBCUTANEOUS

## 2018-06-11 MED ORDER — PEGFILGRASTIM-CBQV 6 MG/0.6ML ~~LOC~~ SOSY
PREFILLED_SYRINGE | SUBCUTANEOUS | Status: AC
  Filled 2018-06-11: qty 0.6

## 2018-06-14 ENCOUNTER — Other Ambulatory Visit: Payer: Self-pay

## 2018-06-14 ENCOUNTER — Inpatient Hospital Stay: Payer: Medicare Other

## 2018-06-14 DIAGNOSIS — Z5189 Encounter for other specified aftercare: Secondary | ICD-10-CM | POA: Diagnosis not present

## 2018-06-14 DIAGNOSIS — C3491 Malignant neoplasm of unspecified part of right bronchus or lung: Secondary | ICD-10-CM

## 2018-06-14 LAB — CBC WITH DIFFERENTIAL (CANCER CENTER ONLY)
Abs Immature Granulocytes: 0.5 10*3/uL — ABNORMAL HIGH (ref 0.00–0.07)
Basophils Absolute: 0 10*3/uL (ref 0.0–0.1)
Basophils Relative: 0 %
Eosinophils Absolute: 0 10*3/uL (ref 0.0–0.5)
Eosinophils Relative: 0 %
HCT: 28.2 % — ABNORMAL LOW (ref 36.0–46.0)
Hemoglobin: 8.7 g/dL — ABNORMAL LOW (ref 12.0–15.0)
Lymphocytes Relative: 3 %
Lymphs Abs: 0.8 10*3/uL (ref 0.7–4.0)
MCH: 31.8 pg (ref 26.0–34.0)
MCHC: 30.9 g/dL (ref 30.0–36.0)
MCV: 102.9 fL — ABNORMAL HIGH (ref 80.0–100.0)
Metamyelocytes Relative: 2 %
Monocytes Absolute: 0.3 10*3/uL (ref 0.1–1.0)
Monocytes Relative: 1 %
Neutro Abs: 23.6 10*3/uL — ABNORMAL HIGH (ref 1.7–17.7)
Neutrophils Relative %: 94 %
Platelet Count: 321 10*3/uL (ref 150–400)
RBC: 2.74 MIL/uL — ABNORMAL LOW (ref 3.87–5.11)
RDW: 15.2 % (ref 11.5–15.5)
WBC Count: 25.1 10*3/uL — ABNORMAL HIGH (ref 4.0–10.5)
nRBC: 0 % (ref 0.0–0.2)

## 2018-06-14 LAB — CMP (CANCER CENTER ONLY)
ALT: 9 U/L (ref 0–44)
AST: 14 U/L — ABNORMAL LOW (ref 15–41)
Albumin: 2.7 g/dL — ABNORMAL LOW (ref 3.5–5.0)
Alkaline Phosphatase: 136 U/L — ABNORMAL HIGH (ref 38–126)
Anion gap: 7 (ref 5–15)
BUN: 15 mg/dL (ref 8–23)
CO2: 28 mmol/L (ref 22–32)
Calcium: 7.8 mg/dL — ABNORMAL LOW (ref 8.9–10.3)
Chloride: 105 mmol/L (ref 98–111)
Creatinine: 0.73 mg/dL (ref 0.44–1.00)
GFR, Est AFR Am: >60 mL/min (ref >60)
GFR, Estimated: >60 mL/min (ref >60)
Glucose, Bld: 99 mg/dL (ref 70–99)
Potassium: 4.3 mmol/L (ref 3.5–5.1)
Sodium: 140 mmol/L (ref 135–145)
Total Bilirubin: 0.4 mg/dL (ref 0.3–1.2)
Total Protein: 5.4 g/dL — ABNORMAL LOW (ref 6.5–8.1)

## 2018-06-17 ENCOUNTER — Other Ambulatory Visit: Payer: Self-pay | Admitting: Physician Assistant

## 2018-06-17 ENCOUNTER — Telehealth: Payer: Self-pay

## 2018-06-17 NOTE — Telephone Encounter (Signed)
Spoke with pt about switching to Neulasta On-Pro. Pt would perfer to speak with MD at next office visit before making a decision.

## 2018-06-21 ENCOUNTER — Other Ambulatory Visit: Payer: Self-pay

## 2018-06-21 ENCOUNTER — Other Ambulatory Visit: Payer: Self-pay | Admitting: *Deleted

## 2018-06-21 ENCOUNTER — Inpatient Hospital Stay: Payer: Medicare Other

## 2018-06-21 ENCOUNTER — Other Ambulatory Visit: Payer: Self-pay | Admitting: Physician Assistant

## 2018-06-21 DIAGNOSIS — C3491 Malignant neoplasm of unspecified part of right bronchus or lung: Secondary | ICD-10-CM

## 2018-06-21 DIAGNOSIS — Z5189 Encounter for other specified aftercare: Secondary | ICD-10-CM | POA: Diagnosis not present

## 2018-06-21 LAB — CBC WITH DIFFERENTIAL (CANCER CENTER ONLY)
Abs Immature Granulocytes: 3.63 10*3/uL — ABNORMAL HIGH (ref 0.00–0.07)
Basophils Absolute: 0.1 10*3/uL (ref 0.0–0.1)
Basophils Relative: 0 %
Eosinophils Absolute: 0 10*3/uL (ref 0.0–0.5)
Eosinophils Relative: 0 %
HCT: 29.3 % — ABNORMAL LOW (ref 36.0–46.0)
Hemoglobin: 9 g/dL — ABNORMAL LOW (ref 12.0–15.0)
Immature Granulocytes: 12 %
Lymphocytes Relative: 6 %
Lymphs Abs: 1.8 10*3/uL (ref 0.7–4.0)
MCH: 31.5 pg (ref 26.0–34.0)
MCHC: 30.7 g/dL (ref 30.0–36.0)
MCV: 102.4 fL — ABNORMAL HIGH (ref 80.0–100.0)
Monocytes Absolute: 1.2 10*3/uL — ABNORMAL HIGH (ref 0.1–1.0)
Monocytes Relative: 4 %
Neutro Abs: 23.8 10*3/uL — ABNORMAL HIGH (ref 1.7–7.7)
Neutrophils Relative %: 78 %
Platelet Count: 152 10*3/uL (ref 150–400)
RBC: 2.86 MIL/uL — ABNORMAL LOW (ref 3.87–5.11)
RDW: 15.9 % — ABNORMAL HIGH (ref 11.5–15.5)
WBC Count: 30.5 10*3/uL — ABNORMAL HIGH (ref 4.0–10.5)
nRBC: 0.3 % — ABNORMAL HIGH (ref 0.0–0.2)

## 2018-06-21 LAB — CMP (CANCER CENTER ONLY)
ALT: 12 U/L (ref 0–44)
AST: 17 U/L (ref 15–41)
Albumin: 2.9 g/dL — ABNORMAL LOW (ref 3.5–5.0)
Alkaline Phosphatase: 148 U/L — ABNORMAL HIGH (ref 38–126)
Anion gap: 10 (ref 5–15)
BUN: 13 mg/dL (ref 8–23)
CO2: 24 mmol/L (ref 22–32)
Calcium: 8.1 mg/dL — ABNORMAL LOW (ref 8.9–10.3)
Chloride: 106 mmol/L (ref 98–111)
Creatinine: 0.86 mg/dL (ref 0.44–1.00)
GFR, Est AFR Am: >60 mL/min (ref >60)
GFR, Estimated: >60 mL/min (ref >60)
Glucose, Bld: 93 mg/dL (ref 70–99)
Potassium: 4.4 mmol/L (ref 3.5–5.1)
Sodium: 140 mmol/L (ref 135–145)
Total Bilirubin: 0.2 mg/dL — ABNORMAL LOW (ref 0.3–1.2)
Total Protein: 6.2 g/dL — ABNORMAL LOW (ref 6.5–8.1)

## 2018-06-21 LAB — TSH: TSH: 0.652 u[IU]/mL (ref 0.308–3.960)

## 2018-06-25 ENCOUNTER — Encounter (HOSPITAL_COMMUNITY): Payer: Self-pay

## 2018-06-25 ENCOUNTER — Other Ambulatory Visit: Payer: Self-pay

## 2018-06-25 ENCOUNTER — Ambulatory Visit (HOSPITAL_COMMUNITY)
Admission: RE | Admit: 2018-06-25 | Discharge: 2018-06-25 | Disposition: A | Discharge status: Home / Self Care | Payer: Medicare Other | Source: Ambulatory Visit | Attending: Physician Assistant | Admitting: Physician Assistant

## 2018-06-25 ENCOUNTER — Ambulatory Visit (HOSPITAL_COMMUNITY): Payer: Medicare Other

## 2018-06-25 DIAGNOSIS — C3491 Malignant neoplasm of unspecified part of right bronchus or lung: Secondary | ICD-10-CM | POA: Diagnosis present

## 2018-06-28 ENCOUNTER — Other Ambulatory Visit: Payer: Self-pay

## 2018-06-28 ENCOUNTER — Encounter: Payer: Self-pay | Admitting: Internal Medicine

## 2018-06-28 ENCOUNTER — Inpatient Hospital Stay (HOSPITAL_BASED_OUTPATIENT_CLINIC_OR_DEPARTMENT_OTHER): Payer: Medicare Other | Admitting: Internal Medicine

## 2018-06-28 ENCOUNTER — Inpatient Hospital Stay: Payer: Medicare Other

## 2018-06-28 VITALS — BP 119/69 | HR 81 | Temp 97.3°F | Resp 18 | Ht 64.0 in | Wt 138.1 lb

## 2018-06-28 DIAGNOSIS — R531 Weakness: Secondary | ICD-10-CM

## 2018-06-28 DIAGNOSIS — K219 Gastro-esophageal reflux disease without esophagitis: Secondary | ICD-10-CM

## 2018-06-28 DIAGNOSIS — C787 Secondary malignant neoplasm of liver and intrahepatic bile duct: Secondary | ICD-10-CM

## 2018-06-28 DIAGNOSIS — C3491 Malignant neoplasm of unspecified part of right bronchus or lung: Secondary | ICD-10-CM

## 2018-06-28 DIAGNOSIS — C342 Malignant neoplasm of middle lobe, bronchus or lung: Secondary | ICD-10-CM

## 2018-06-28 DIAGNOSIS — Z7951 Long term (current) use of inhaled steroids: Secondary | ICD-10-CM

## 2018-06-28 DIAGNOSIS — R5383 Other fatigue: Secondary | ICD-10-CM

## 2018-06-28 DIAGNOSIS — C7951 Secondary malignant neoplasm of bone: Secondary | ICD-10-CM | POA: Diagnosis not present

## 2018-06-28 DIAGNOSIS — Z5112 Encounter for antineoplastic immunotherapy: Secondary | ICD-10-CM

## 2018-06-28 DIAGNOSIS — Z5111 Encounter for antineoplastic chemotherapy: Secondary | ICD-10-CM

## 2018-06-28 DIAGNOSIS — I10 Essential (primary) hypertension: Secondary | ICD-10-CM

## 2018-06-28 DIAGNOSIS — J449 Chronic obstructive pulmonary disease, unspecified: Secondary | ICD-10-CM

## 2018-06-28 DIAGNOSIS — Z79899 Other long term (current) drug therapy: Secondary | ICD-10-CM

## 2018-06-28 DIAGNOSIS — Z5189 Encounter for other specified aftercare: Secondary | ICD-10-CM | POA: Diagnosis not present

## 2018-06-28 LAB — CBC WITH DIFFERENTIAL (CANCER CENTER ONLY)
Abs Immature Granulocytes: 1.24 10*3/uL — ABNORMAL HIGH (ref 0.00–0.07)
Basophils Absolute: 0.1 10*3/uL (ref 0.0–0.1)
Basophils Relative: 1 %
Eosinophils Absolute: 0 10*3/uL (ref 0.0–0.5)
Eosinophils Relative: 0 %
HCT: 28.3 % — ABNORMAL LOW (ref 36.0–46.0)
Hemoglobin: 8.8 g/dL — ABNORMAL LOW (ref 12.0–15.0)
Immature Granulocytes: 5 %
Lymphocytes Relative: 6 %
Lymphs Abs: 1.3 10*3/uL (ref 0.7–4.0)
MCH: 31.4 pg (ref 26.0–34.0)
MCHC: 31.1 g/dL (ref 30.0–36.0)
MCV: 101.1 fL — ABNORMAL HIGH (ref 80.0–100.0)
Monocytes Absolute: 0.7 10*3/uL (ref 0.1–1.0)
Monocytes Relative: 3 %
Neutro Abs: 19.7 10*3/uL — ABNORMAL HIGH (ref 1.7–7.7)
Neutrophils Relative %: 85 %
Platelet Count: 471 10*3/uL — ABNORMAL HIGH (ref 150–400)
RBC: 2.8 MIL/uL — ABNORMAL LOW (ref 3.87–5.11)
RDW: 17 % — ABNORMAL HIGH (ref 11.5–15.5)
WBC Count: 23.1 10*3/uL — ABNORMAL HIGH (ref 4.0–10.5)
nRBC: 0 % (ref 0.0–0.2)

## 2018-06-28 LAB — CMP (CANCER CENTER ONLY)
ALT: 11 U/L (ref 0–44)
AST: 18 U/L (ref 15–41)
Albumin: 2.7 g/dL — ABNORMAL LOW (ref 3.5–5.0)
Alkaline Phosphatase: 112 U/L (ref 38–126)
Anion gap: 9 (ref 5–15)
BUN: 13 mg/dL (ref 8–23)
CO2: 25 mmol/L (ref 22–32)
Calcium: 8.3 mg/dL — ABNORMAL LOW (ref 8.9–10.3)
Chloride: 106 mmol/L (ref 98–111)
Creatinine: 0.79 mg/dL (ref 0.44–1.00)
GFR, Est AFR Am: >60 mL/min (ref >60)
GFR, Estimated: >60 mL/min (ref >60)
Glucose, Bld: 97 mg/dL (ref 70–99)
Potassium: 4.3 mmol/L (ref 3.5–5.1)
Sodium: 140 mmol/L (ref 135–145)
Total Bilirubin: <0.2 mg/dL — ABNORMAL LOW (ref 0.3–1.2)
Total Protein: 5.6 g/dL — ABNORMAL LOW (ref 6.5–8.1)

## 2018-06-28 MED ORDER — SODIUM CHLORIDE 0.9 % IV SOLN: 364.0000 mg | Freq: Once | INTRAVENOUS | Status: DC

## 2018-06-28 MED ORDER — SODIUM CHLORIDE 0.9 % IV SOLN
Freq: Once | INTRAVENOUS | Status: AC
  Administered 2018-06-28: 12:00:00 via INTRAVENOUS
  Filled 2018-06-28: qty 5

## 2018-06-28 MED ORDER — SODIUM CHLORIDE 0.9 % IV SOLN
80.0000 mg/m2 | Freq: Once | INTRAVENOUS | Status: AC
  Administered 2018-06-28: 15:00:00 140 mg via INTRAVENOUS
  Filled 2018-06-28: qty 7

## 2018-06-28 MED ORDER — HEPARIN SOD (PORK) LOCK FLUSH 100 UNIT/ML IV SOLN
500.0000 [IU] | Freq: Once | INTRAVENOUS | Status: AC | PRN
  Administered 2018-06-28: 500 [IU]
  Filled 2018-06-28: qty 5

## 2018-06-28 MED ORDER — PALONOSETRON HCL INJECTION 0.25 MG/5ML
INTRAVENOUS | Status: AC
  Filled 2018-06-28: qty 5

## 2018-06-28 MED ORDER — PALONOSETRON HCL INJECTION 0.25 MG/5ML
0.2500 mg | Freq: Once | INTRAVENOUS | Status: AC
  Administered 2018-06-28: 0.25 mg via INTRAVENOUS

## 2018-06-28 MED ORDER — SODIUM CHLORIDE 0.9 % IV SOLN
291.2000 mg | Freq: Once | INTRAVENOUS | Status: AC
  Administered 2018-06-28: 14:00:00 290 mg via INTRAVENOUS
  Filled 2018-06-28: qty 29

## 2018-06-28 MED ORDER — SODIUM CHLORIDE 0.9 % IV SOLN
Freq: Once | INTRAVENOUS | Status: AC
  Administered 2018-06-28: 12:00:00 via INTRAVENOUS
  Filled 2018-06-28: qty 250

## 2018-06-28 MED ORDER — SODIUM CHLORIDE 0.9 % IV SOLN
1200.0000 mg | Freq: Once | INTRAVENOUS | Status: AC
  Administered 2018-06-28: 1200 mg via INTRAVENOUS
  Filled 2018-06-28: qty 20

## 2018-06-28 MED ORDER — SODIUM CHLORIDE 0.9% FLUSH
10.0000 mL | INTRAVENOUS | Status: DC | PRN
  Administered 2018-06-28: 10 mL
  Filled 2018-06-28: qty 10

## 2018-06-28 NOTE — Progress Notes (Signed)
[employer] [phone]   [phone]  OFFICE PROGRESS NOTE  Sinda Du, MD [place] [address]  DIAGNOSIS: Extensive stage (T2b, N3, M1c)small cell lung cancer presented with right middle lobe lung mass in addition to right hilar and mediastinal lymphadenopathy as well as metastatic disease to the bone and liver diagnosed in February 2020.   PRIOR THERAPY: None   CURRENT THERAPY: Dose reduced carboplatin for AUC of 4 on day 1, etoposide 80 mg/M2 on days 1, 2 and 3 with Tecentriq 1200 mg IV every 3 weeks with Neulasta support. First dose 05/17/2018. Status post 2 cycles.   INTERVAL HISTORY: [name] 78 y.o. female returns to the clinic today for follow-up visit.  The patient is feeling fine today with no concerning complaints.  She tolerated the second round of her treatment well with no concerning complaints except for mild fatigue.  She denied having any significant nausea, vomiting, diarrhea or constipation.  She denied having any headache or visual changes.  The patient has no fever or chills.  She had repeat CT scan of the chest, abdomen pelvis performed recently and she is here for evaluation and discussion of her scan results before starting cycle #3.  MEDICAL HISTORY: Past Medical History:  Diagnosis Date   COPD (chronic obstructive pulmonary disease) (HCC)    Dyspnea    GERD (gastroesophageal reflux disease)    On home oxygen therapy    Tachycardia     ALLERGIES:  is allergic to morphine; oxycodone-acetaminophen; celecoxib; ciprofloxacin; codeine; iodine; levaquin  [levofloxacin in d5w]; lisinopril; losartan; midazolam; nsaids; penicillins; statins; atorvastatin; citalopram; colesevelam; fenofibrate; tramadol hcl; aleve [naproxen sodium]; azithromycin; cyclobenzaprine; erythromycin; fosamax [alendronate]; meloxicam; moxifloxacin; and sulfonamide derivatives.  MEDICATIONS:  Current Outpatient  Medications  Medication Sig Dispense Refill   acetaminophen (TYLENOL) 325 MG tablet Take by mouth. arthritis     albuterol (PROVENTIL HFA;VENTOLIN HFA) 108 (90 Base) MCG/ACT inhaler Inhale into the lungs every 6 (six) hours as needed for wheezing or shortness of breath.     Azelastine HCl 0.15 % SOLN 1-2 sprays each nostril 2 times per day as needed. 30 mL 5   budesonide-formoterol (SYMBICORT) 160-4.5 MCG/ACT inhaler Inhale 2 puffs into the lungs 2 (two) times daily.     carvedilol (COREG) 6.25 MG tablet Take 1 tablet (6.25 mg total) by mouth 2 (two) times daily with a meal. 180 tablet 3   diphenhydrAMINE (BENADRYL) 25 MG tablet Take by mouth. As needed for allergies     gabapentin (NEURONTIN) 300 MG capsule Take 300 mg by mouth 2 (two) times daily.   2   ipratropium (ATROVENT) 0.02 % nebulizer solution Take 0.5 mg by nebulization 4 (four) times daily as needed for wheezing or shortness of breath.     lidocaine-prilocaine (EMLA) cream Apply 1 application topically as needed. Apply on skin over port site 1-2 hours prior to treatment.  Cove with plastic wrap. (Patient not taking: Reported on 05/21/2018) 30 g 0   losartan (COZAAR) 25 MG tablet Take 1 tablet (25 mg total) by mouth daily. 90 tablet 3   omeprazole (PRILOSEC) 20 MG capsule Take by mouth.     OXYGEN Inhale 2-3 L into the lungs.     pravastatin (PRAVACHOL) 80 MG tablet TAKE ONE TABLET BY MOUTH ONCE DAILY IN THE EVENING     predniSONE (DELTASONE) 10 MG tablet Take 10 mg by mouth daily.     predniSONE (DELTASONE) 5 MG tablet  6 tab x 1 day, 5 tab x 1 day, 4 tab x 1 day, 3 tab x 1 day, 2 tab x 1 day, 1 tab x 1 day, stop 21 tablet 0   prochlorperazine (COMPAZINE) 10 MG tablet Take 1 tablet (10 mg total) by mouth every 6 (six) hours as needed for nausea or vomiting. 30 tablet 0   torsemide (DEMADEX) 20 MG tablet TAKE 20 MG DAILY MAY TAKE ADDITIONAL 20 MG AS NEEDED FOR SWELLING 180 tablet 1   triamcinolone (NASACORT) 55 MCG/ACT  AERO nasal inhaler Place into the nose.     No current facility-administered medications for this visit.     SURGICAL HISTORY:  Past Surgical History:  Procedure Laterality Date   APPENDECTOMY     arthroscopc     total knee   BACK SURGERY     FOOT SURGERY     IR IMAGING GUIDED PORT INSERTION  05/25/2018   knee surgery bilateral     PARTIAL HYSTERECTOMY     ruptured disk      REVIEW OF SYSTEMS:  Constitutional: positive for fatigue Eyes: negative Ears, nose, mouth, throat, and face: negative Respiratory: negative Cardiovascular: negative Gastrointestinal: negative Genitourinary:negative Integument/breast: negative Hematologic/lymphatic: negative Musculoskeletal:negative Neurological: negative Behavioral/Psych: negative Endocrine: negative Allergic/Immunologic: negative   PHYSICAL EXAMINATION: General appearance: alert, cooperative, fatigued and no distress Head: Normocephalic, without obvious abnormality, atraumatic Neck: no adenopathy, no JVD, supple, symmetrical, trachea midline and thyroid not enlarged, symmetric, no tenderness/mass/nodules Lymph nodes: Cervical, supraclavicular, and axillary nodes normal. Resp: clear to auscultation bilaterally Back: symmetric, no curvature. ROM normal. No CVA tenderness. Cardio: regular rate and rhythm, S1, S2 normal, no murmur, click, rub or gallop GI: soft, non-tender; bowel sounds normal; no masses,  no organomegaly Extremities: extremities normal, atraumatic, no cyanosis or edema Neurologic: Alert and oriented X 3, normal strength and tone. Normal symmetric reflexes. Normal coordination and gait  ECOG PERFORMANCE STATUS: 1 - Symptomatic but completely ambulatory  Blood pressure 119/69, pulse 81, temperature (!) 97.3 F (36.3 C), temperature source Oral, resp. rate 18, height 5\' 4"  (1.626 m), weight 138 lb 1.6 oz (62.6 kg), SpO2 99 %.  LABORATORY DATA: Lab Results  Component Value Date   WBC 23.1 (H) 06/28/2018    HGB 8.8 (L) 06/28/2018   HCT 28.3 (L) 06/28/2018   MCV 101.1 (H) 06/28/2018   PLT 471 (H) 06/28/2018      Chemistry      Component Value Date/Time   NA 140 06/21/2018 1355   K 4.4 06/21/2018 1355   CL 106 06/21/2018 1355   CO2 24 06/21/2018 1355   BUN 13 06/21/2018 1355   CREATININE 0.86 06/21/2018 1355      Component Value Date/Time   CALCIUM 8.1 (L) 06/21/2018 1355   ALKPHOS 148 (H) 06/21/2018 1355   AST 17 06/21/2018 1355   ALT 12 06/21/2018 1355   BILITOT 0.2 (L) 06/21/2018 1355       RADIOGRAPHIC STUDIES: Ct Abdomen Pelvis Wo Contrast  Result Date: 06/25/2018 CLINICAL DATA:  Right lung cancer diagnosed in February 2020. Chemotherapy ongoing. Previous appendectomy and hysterectomy. EXAM: CT CHEST, ABDOMEN AND PELVIS WITHOUT CONTRAST TECHNIQUE: Multidetector CT imaging of the chest, abdomen and pelvis was performed following the standard protocol without IV contrast. COMPARISON:  Chest CT 04/07/2018.  PET-CT 04/22/2018. 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 No acute vascular findings are seen on noncontrast imaging.  Right IJ Port-A-Cath extends to the superior cavoatrial junction. There is stable mild cardiomegaly and a small pericardial effusion. Mediastinum/Nodes: Although suboptimally evaluated without contrast, the previously demonstrated enlarged mediastinal and right hilar lymph nodes have resolved. Currently, no enlarged mediastinal, hilar or axillary lymph nodes are identified. The thyroid gland is mildly enlarged and nodular, although appears stable. The trachea and esophagus demonstrate no significant findings. Lungs/Pleura: There is a trace left pleural effusion. Moderate centrilobular emphysema and diffuse central airway thickening are again noted. 6 x 4 mm right upper lobe nodule on image 35/4 is stable. Nodularity in the  right lower lobe has improved. The right middle lobe remains collapsed. On the left, there is new collapse of the left lower lobe with associated consolidation. No suspicious left upper lobe nodularity. Musculoskeletal/Chest wall: No chest wall mass. There is a stable compression deformity at T9. There is a new T12 compression deformity compared with the prior chest CT. Both fractures are associated with mild osseous retropulsion. No epidural tumor or focal lytic lesion identified. There is multilevel spondylosis. CT ABDOMEN AND PELVIS FINDINGS Hepatobiliary: Multifocal hepatic metastatic disease is again noted, not optimally evaluated without contrast and best seen on narrow windows. Overall appearance appears slightly improved compared with the PET-CT. For example, there is a 16 mm lesion in the left lobe (image 52/2) which previously measured 20 mm. No definite enlarging lesions. No evidence of gallstones, gallbladder wall thickening or biliary dilatation. Pancreas: Unremarkable. No pancreatic ductal dilatation or surrounding inflammatory changes. Spleen: Normal in size without focal abnormality. Adrenals/Urinary Tract: Both adrenal glands appear normal. The kidneys, ureters and bladder appear unremarkable. No evidence of hydronephrosis. Stomach/Bowel: No evidence of bowel wall thickening, distention or surrounding inflammatory change. The stomach is incompletely distended. There are mild diverticular changes within the descending and sigmoid colon. Vascular/Lymphatic: There are no enlarged abdominal or pelvic lymph nodes. 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 Reproductive: Hysterectomy. No adnexal mass. Mild pelvic floor laxity. Other: Intact anterior abdominal wall. No ascites or peritoneal nodularity. Musculoskeletal: Bilateral sacral sclerosis suspicious for insufficiency fractures. Alternatively, this could be related to  interval radiation therapy if appropriate. No focal lytic lesion or pathologic fracture identified. There are degenerative and postsurgical changes in the lower lumbar spine. IMPRESSION: 1. Interval resolution of mediastinal and right hilar adenopathy. 2. Right lower lobe pulmonary nodularity has improved. There is new complete collapse of the left lower lobe, obscuring the previously demonstrated nodularity. Right middle lobe collapse and a small right upper lobe nodule are unchanged. 3. The hepatic metastatic disease appears mildly improved, although suboptimally evaluated without contrast. No disease progression identified. 4. The multifocal osseous metastases seen on PET-CT are not well visualized. New T12 compression fracture and suspected bilateral sacral insufficiency fractures. 5. Aortic Atherosclerosis (ICD10-I70.0) and Emphysema (ICD10-J43.9). 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 Electronically Signed   By: Richardean Sale M.D.   On: 06/25/2018 17:07   Ct Chest Wo Contrast  Result Date: 06/25/2018 CLINICAL DATA:  Right lung cancer diagnosed in February 2020. Chemotherapy ongoing. Previous appendectomy and hysterectomy. EXAM: CT CHEST, ABDOMEN AND PELVIS WITHOUT CONTRAST TECHNIQUE: Multidetector CT imaging of the chest, abdomen and pelvis was performed following the standard protocol without IV contrast. COMPARISON:  Chest CT 04/07/2018.  PET-CT 04/22/2018. 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 No acute vascular findings are  seen on noncontrast imaging. Right IJ Port-A-Cath extends to the superior cavoatrial junction. There is stable mild cardiomegaly and a small pericardial effusion. Mediastinum/Nodes: Although suboptimally evaluated without contrast, the previously demonstrated enlarged mediastinal and right hilar lymph nodes  have resolved. Currently, no enlarged mediastinal, hilar or axillary lymph nodes are identified. The thyroid gland is mildly enlarged and nodular, although appears stable. The trachea and esophagus demonstrate no significant findings. Lungs/Pleura: There is a trace left pleural effusion. Moderate centrilobular emphysema and diffuse central airway thickening are again noted. 6 x 4 mm right upper lobe nodule on image 35/4 is stable. Nodularity in the right lower lobe has improved. The right middle lobe remains collapsed. On the left, there is new collapse of the left lower lobe with associated consolidation. No suspicious left upper lobe nodularity. Musculoskeletal/Chest wall: No chest wall mass. There is a stable compression deformity at T9. There is a new T12 compression deformity compared with the prior chest CT. Both fractures are associated with mild osseous retropulsion. No epidural tumor or focal lytic lesion identified. There is multilevel spondylosis. CT ABDOMEN AND PELVIS FINDINGS Hepatobiliary: Multifocal hepatic metastatic disease is again noted, not optimally evaluated without contrast and best seen on narrow windows. Overall appearance appears slightly improved compared with the PET-CT. For example, there is a 16 mm lesion in the left lobe (image 52/2) which previously measured 20 mm. No definite enlarging lesions. No evidence of gallstones, gallbladder wall thickening or biliary dilatation. Pancreas: Unremarkable. No pancreatic ductal dilatation or surrounding inflammatory changes. Spleen: Normal in size without focal abnormality. Adrenals/Urinary Tract: Both adrenal glands appear normal. The kidneys, ureters and bladder appear unremarkable. No evidence of hydronephrosis. Stomach/Bowel: No evidence of bowel wall thickening, distention or surrounding inflammatory change. The stomach is incompletely distended. There are mild diverticular changes within the descending and sigmoid colon. Vascular/Lymphatic:  There are no enlarged abdominal or pelvic lymph nodes. 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 Reproductive: Hysterectomy. No adnexal mass. Mild pelvic floor laxity. Other: Intact anterior abdominal wall. No ascites or peritoneal nodularity. Musculoskeletal: Bilateral sacral sclerosis suspicious for insufficiency fractures. Alternatively, this could be related to interval radiation therapy if appropriate. No focal lytic lesion or pathologic fracture identified. There are degenerative and postsurgical changes in the lower lumbar spine. IMPRESSION: 1. Interval resolution of mediastinal and right hilar adenopathy. 2. Right lower lobe pulmonary nodularity has improved. There is new complete collapse of the left lower lobe, obscuring the previously demonstrated nodularity. Right middle lobe collapse and a small right upper lobe nodule are unchanged. 3. The hepatic metastatic disease appears mildly improved, although suboptimally evaluated without contrast. No disease progression identified. 4. The multifocal osseous metastases seen on PET-CT are not well visualized. New T12 compression fracture and suspected bilateral sacral insufficiency fractures. 5. Aortic Atherosclerosis (ICD10-I70.0) and Emphysema (ICD10-J43.9). 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 Electronically Signed   By: Richardean Sale M.D.   On: 06/25/2018 17:07    ASSESSMENT AND PLAN: This is a very pleasant 78 years old white female with extensive stage small cell lung cancer diagnosed in February 2020 and currently undergoing systemic chemotherapy with carboplatin, etoposide and Tecentriq status post 2 cycles. The patient has been tolerating this treatment well with no concerning adverse effects. She had repeat CT scan of the chest, abdomen and pelvis performed recently.  I personally and independently reviewed the scans and discussed the results  with the patient today. Her scan showed  improvement of her disease. I recommended for her to proceed with cycle #3 today as scheduled. I will see the patient back for follow-up visit in 3 weeks for evaluation before the next cycle of her treatment. The patient was advised to call immediately if she has any concerning symptoms in the interval. The patient voices understanding of current disease status and treatment options and is in agreement with the current care plan.  All questions were answered. The patient knows to call the clinic with any problems, questions or concerns. We can certainly see the patient much sooner if necessary.  I spent 15 minutes counseling the patient face to face. The total time spent in the appointment was 25 minutes.  Disclaimer: This note was dictated with voice recognition software. Similar sounding words can inadvertently be transcribed and may not be corrected upon review.

## 2018-06-28 NOTE — Progress Notes (Signed)
confirmed Carbo dose AUC = 4 w/ Dr. Julien Nordmann. Kennith Center, Pharm.D., CPP 06/28/2018@12 :25 PM

## 2018-06-28 NOTE — Progress Notes (Signed)
Pt does not want Onpro.  PA approved for Neulasta. Orders updated. Pt has inj appt for 07/01/18 at 3:45 pm. Kennith Center, Pharm.D., CPP 06/28/2018@12 :49 PM

## 2018-06-28 NOTE — Patient Instructions (Signed)
Coronavirus (COVID-19) Are you at risk?  Are you at risk for the Coronavirus (COVID-19)?  To be considered HIGH RISK for Coronavirus (COVID-19), you have to meet the following criteria:  . Traveled to [place], [place], [place], [place] or [place]; or in the [place] to [place], [place], [place], or [place]; and have fever, cough, and shortness of breath within the last 2 weeks of travel OR . Been in close contact with a person diagnosed with COVID-19 within the last 2 weeks and have fever, cough, and shortness of breath . IF YOU DO NOT MEET THESE CRITERIA, YOU ARE CONSIDERED LOW RISK FOR COVID-19.  What to do if you are HIGH RISK for COVID-19?  . If you are having a medical emergency, call 911. . Seek medical care right away. Before you go to a doctor's office, urgent care or emergency department, call ahead and tell them about your recent travel, contact with someone diagnosed with COVID-19, and your symptoms. You should receive instructions from your physician's office regarding next steps of care.  . When you arrive at healthcare provider, tell the healthcare staff immediately you have returned from visiting [place], [place], [place], [place] or [place]; or traveled in the [place] to [place], [place], [place], or [place]; in the last two weeks or you have been in close contact with a person diagnosed with COVID-19 in the last 2 weeks.   . Tell the health care staff about your symptoms: fever, cough and shortness of breath. . After you have been seen by a medical provider, you will be either: o Tested for (COVID-19) and discharged home on quarantine except to seek medical care if symptoms worsen, and asked to  - Stay home and avoid contact with others until you get your results (4-5 days)  - Avoid travel on public transportation if possible (such as bus, train, or airplane) or o Sent to the Emergency Department by EMS for evaluation, COVID-19 testing, and possible  admission depending on your condition and test results.  What to do if you are LOW RISK for COVID-19?  Reduce your risk of any infection by using the same precautions used for avoiding the common cold or flu:  . Wash your hands often with soap and warm water for at least 20 seconds.  If soap and water are not readily available, use an alcohol-based hand sanitizer with at least 60% alcohol.  . If coughing or sneezing, cover your mouth and nose by coughing or sneezing into the elbow areas of your shirt or coat, into a tissue or into your sleeve (not your hands). . Avoid shaking hands with others and consider head nods or verbal greetings only. . Avoid touching your eyes, nose, or mouth with unwashed hands.  . Avoid close contact with people who are sick. . Avoid places or events with large numbers of people in one location, like concerts or sporting events. . Carefully consider travel plans you have or are making. . If you are planning any travel outside or inside the [place], visit the CDC's Travelers' Health webpage for the latest health notices. . If you have some symptoms but not all symptoms, continue to monitor at home and seek medical attention if your symptoms worsen. . If you are having a medical emergency, call 911.   ADDITIONAL HEALTHCARE OPTIONS FOR PATIENTS  [facility] / e-Visit: [email or web address]         [facility]: [phone]  [facility]: [facility]: [place] Discharge Instructions for Patients Receiving Chemotherapy  Today you received the following chemotherapy agents :  Tecentriq,  Carboplatin,  Etoposide.  To help prevent nausea and vomiting after your treatment, we encourage you to take your nausea medication as prescribed.   If you develop nausea and vomiting that is not controlled by your nausea  medication, call the clinic.   BELOW ARE SYMPTOMS THAT SHOULD BE REPORTED IMMEDIATELY:  *FEVER GREATER THAN 100.5 F  *CHILLS WITH OR WITHOUT FEVER  NAUSEA AND VOMITING THAT IS NOT CONTROLLED WITH YOUR NAUSEA MEDICATION  *UNUSUAL SHORTNESS OF BREATH  *UNUSUAL BRUISING OR BLEEDING  TENDERNESS IN MOUTH AND THROAT WITH OR WITHOUT PRESENCE OF ULCERS  *URINARY PROBLEMS  *BOWEL PROBLEMS  UNUSUAL RASH Items with * indicate a potential emergency and should be followed up as soon as possible.  Feel free to call the clinic should you have any questions or concerns. The clinic phone number is [phone].  Please show the [facility] at check-in to the Emergency Department and triage nurse.

## 2018-06-29 ENCOUNTER — Other Ambulatory Visit: Payer: Self-pay

## 2018-06-29 ENCOUNTER — Inpatient Hospital Stay: Payer: Medicare Other

## 2018-06-29 VITALS — BP 138/80 | HR 83 | Temp 98.3°F | Resp 18

## 2018-06-29 DIAGNOSIS — Z5189 Encounter for other specified aftercare: Secondary | ICD-10-CM | POA: Diagnosis not present

## 2018-06-29 DIAGNOSIS — C3491 Malignant neoplasm of unspecified part of right bronchus or lung: Secondary | ICD-10-CM

## 2018-06-29 MED ORDER — DEXAMETHASONE SODIUM PHOSPHATE 10 MG/ML IJ SOLN
10.0000 mg | Freq: Once | INTRAMUSCULAR | Status: AC
  Administered 2018-06-29: 10 mg via INTRAVENOUS

## 2018-06-29 MED ORDER — SODIUM CHLORIDE 0.9% FLUSH
10.0000 mL | INTRAVENOUS | Status: DC | PRN
  Administered 2018-06-29: 10 mL
  Filled 2018-06-29: qty 10

## 2018-06-29 MED ORDER — HEPARIN SOD (PORK) LOCK FLUSH 100 UNIT/ML IV SOLN
500.0000 [IU] | Freq: Once | INTRAVENOUS | Status: AC | PRN
  Administered 2018-06-29: 500 [IU]
  Filled 2018-06-29: qty 5

## 2018-06-29 MED ORDER — DEXAMETHASONE SODIUM PHOSPHATE 10 MG/ML IJ SOLN
INTRAMUSCULAR | Status: AC
  Filled 2018-06-29: qty 1

## 2018-06-29 MED ORDER — SODIUM CHLORIDE 0.9 % IV SOLN
Freq: Once | INTRAVENOUS | Status: AC
  Administered 2018-06-29: 15:00:00 via INTRAVENOUS
  Filled 2018-06-29: qty 250

## 2018-06-29 MED ORDER — SODIUM CHLORIDE 0.9 % IV SOLN
80.0000 mg/m2 | Freq: Once | INTRAVENOUS | Status: AC
  Administered 2018-06-29: 16:00:00 140 mg via INTRAVENOUS
  Filled 2018-06-29: qty 7

## 2018-06-29 NOTE — Patient Instructions (Signed)
[facility] Discharge Instructions for Patients Receiving Chemotherapy  Today you received the following chemotherapy agents Etoposide (VEPESID).  To help prevent nausea and vomiting after your treatment, we encourage you to take your nausea medication as prescribed.   If you develop nausea and vomiting that is not controlled by your nausea medication, call the clinic.   BELOW ARE SYMPTOMS THAT SHOULD BE REPORTED IMMEDIATELY:  *FEVER GREATER THAN 100.5 F  *CHILLS WITH OR WITHOUT FEVER  NAUSEA AND VOMITING THAT IS NOT CONTROLLED WITH YOUR NAUSEA MEDICATION  *UNUSUAL SHORTNESS OF BREATH  *UNUSUAL BRUISING OR BLEEDING  TENDERNESS IN MOUTH AND THROAT WITH OR WITHOUT PRESENCE OF ULCERS  *URINARY PROBLEMS  *BOWEL PROBLEMS  UNUSUAL RASH Items with * indicate a potential emergency and should be followed up as soon as possible.  Feel free to call the clinic should you have any questions or concerns. The clinic phone number is [phone].  Please show the [facility] at check-in to the Emergency Department and triage nurse.  Coronavirus (COVID-19) Are you at risk?  Are you at risk for the Coronavirus (COVID-19)?  To be considered HIGH RISK for Coronavirus (COVID-19), you have to meet the following criteria:  . Traveled to [place], [place], [place], [place] or [place]; or in the [place] to [place], [place], [place], or [place]; and have fever, cough, and shortness of breath within the last 2 weeks of travel OR . Been in close contact with a person diagnosed with COVID-19 within the last 2 weeks and have fever, cough, and shortness of breath . IF YOU DO NOT MEET THESE CRITERIA, YOU ARE CONSIDERED LOW RISK FOR COVID-19.  What to do if you are HIGH RISK for COVID-19?  Marland Kitchen If you are having a medical emergency, call 911. . Seek medical care right away. Before you go to a doctor's office, urgent care or emergency department, call ahead and tell them  about your recent travel, contact with someone diagnosed with COVID-19, and your symptoms. You should receive instructions from your physician's office regarding next steps of care.  . When you arrive at healthcare provider, tell the healthcare staff immediately you have returned from visiting [place], [place], [place], [place] or [place]; or traveled in the [place] to [place], [place], [place], or [place]; in the last two weeks or you have been in close contact with a person diagnosed with COVID-19 in the last 2 weeks.   . Tell the health care staff about your symptoms: fever, cough and shortness of breath. . After you have been seen by a medical provider, you will be either: o Tested for (COVID-19) and discharged home on quarantine except to seek medical care if symptoms worsen, and asked to  - Stay home and avoid contact with others until you get your results (4-5 days)  - Avoid travel on public transportation if possible (such as bus, train, or airplane) or o Sent to the Emergency Department by EMS for evaluation, COVID-19 testing, and possible admission depending on your condition and test results.  What to do if you are LOW RISK for COVID-19?  Reduce your risk of any infection by using the same precautions used for avoiding the common cold or flu:  Marland Kitchen Wash your hands often with soap and warm water for at least 20 seconds.  If soap and water are not readily available, use an alcohol-based hand sanitizer with at least 60% alcohol.  . If coughing or  sneezing, cover your mouth and nose by coughing or sneezing into the elbow areas of your shirt or coat, into a tissue or into your sleeve (not your hands). . Avoid shaking hands with others and consider head nods or verbal greetings only. . Avoid touching your eyes, nose, or mouth with unwashed hands.  . Avoid close contact with people who are sick. . Avoid places or events with large numbers of people in one location, like concerts or  sporting events. . Carefully consider travel plans you have or are making. . If you are planning any travel outside or inside the [place], visit the CDC's Travelers' Health webpage for the latest health notices. . If you have some symptoms but not all symptoms, continue to monitor at home and seek medical attention if your symptoms worsen. . If you are having a medical emergency, call 911.   [facility] [facility] / e-Visit: [email or web address]         [facility]: [facility]: [phone]                   [facility]: [phone]

## 2018-06-30 ENCOUNTER — Inpatient Hospital Stay: Payer: Medicare Other

## 2018-06-30 ENCOUNTER — Other Ambulatory Visit: Payer: Self-pay

## 2018-06-30 VITALS — BP 120/79 | HR 72 | Temp 98.2°F | Resp 18

## 2018-06-30 DIAGNOSIS — Z5189 Encounter for other specified aftercare: Secondary | ICD-10-CM | POA: Diagnosis not present

## 2018-06-30 DIAGNOSIS — C3491 Malignant neoplasm of unspecified part of right bronchus or lung: Secondary | ICD-10-CM

## 2018-06-30 MED ORDER — SODIUM CHLORIDE 0.9 % IV SOLN
80.0000 mg/m2 | Freq: Once | INTRAVENOUS | Status: AC
  Administered 2018-06-30: 140 mg via INTRAVENOUS
  Filled 2018-06-30: qty 7

## 2018-06-30 MED ORDER — DEXAMETHASONE SODIUM PHOSPHATE 10 MG/ML IJ SOLN
INTRAMUSCULAR | Status: AC
  Filled 2018-06-30: qty 1

## 2018-06-30 MED ORDER — DEXAMETHASONE SODIUM PHOSPHATE 10 MG/ML IJ SOLN
10.0000 mg | Freq: Once | INTRAMUSCULAR | Status: AC
  Administered 2018-06-30: 10 mg via INTRAVENOUS

## 2018-06-30 MED ORDER — SODIUM CHLORIDE 0.9 % IV SOLN
Freq: Once | INTRAVENOUS | Status: AC
  Administered 2018-06-30: 14:00:00 via INTRAVENOUS
  Filled 2018-06-30: qty 250

## 2018-06-30 MED ORDER — SODIUM CHLORIDE 0.9% FLUSH
10.0000 mL | INTRAVENOUS | Status: DC | PRN
  Administered 2018-06-30: 10 mL
  Filled 2018-06-30: qty 10

## 2018-06-30 MED ORDER — HEPARIN SOD (PORK) LOCK FLUSH 100 UNIT/ML IV SOLN
500.0000 [IU] | Freq: Once | INTRAVENOUS | Status: AC | PRN
  Administered 2018-06-30: 16:00:00 500 [IU]
  Filled 2018-06-30: qty 5

## 2018-06-30 NOTE — Patient Instructions (Signed)
[facility] Discharge Instructions for Patients Receiving Chemotherapy  Today you received the following chemotherapy agents Etoposide (VEPESID).  To help prevent nausea and vomiting after your treatment, we encourage you to take your nausea medication as prescribed.   If you develop nausea and vomiting that is not controlled by your nausea medication, call the clinic.   BELOW ARE SYMPTOMS THAT SHOULD BE REPORTED IMMEDIATELY:  *FEVER GREATER THAN 100.5 F  *CHILLS WITH OR WITHOUT FEVER  NAUSEA AND VOMITING THAT IS NOT CONTROLLED WITH YOUR NAUSEA MEDICATION  *UNUSUAL SHORTNESS OF BREATH  *UNUSUAL BRUISING OR BLEEDING  TENDERNESS IN MOUTH AND THROAT WITH OR WITHOUT PRESENCE OF ULCERS  *URINARY PROBLEMS  *BOWEL PROBLEMS  UNUSUAL RASH Items with * indicate a potential emergency and should be followed up as soon as possible.  Feel free to call the clinic should you have any questions or concerns. The clinic phone number is [phone].  Please show the [facility] at check-in to the Emergency Department and triage nurse.  Coronavirus (COVID-19) Are you at risk?  Are you at risk for the Coronavirus (COVID-19)?  To be considered HIGH RISK for Coronavirus (COVID-19), you have to meet the following criteria:  . Traveled to [place], [place], [place], [place] or [place]; or in the [place] to [place], [place], [place], or [place]; and have fever, cough, and shortness of breath within the last 2 weeks of travel OR . Been in close contact with a person diagnosed with COVID-19 within the last 2 weeks and have fever, cough, and shortness of breath . IF YOU DO NOT MEET THESE CRITERIA, YOU ARE CONSIDERED LOW RISK FOR COVID-19.  What to do if you are HIGH RISK for COVID-19?  Marland Kitchen If you are having a medical emergency, call 911. . Seek medical care right away. Before you go to a doctor's office, urgent care or emergency department, call ahead and tell them  about your recent travel, contact with someone diagnosed with COVID-19, and your symptoms. You should receive instructions from your physician's office regarding next steps of care.  . When you arrive at healthcare provider, tell the healthcare staff immediately you have returned from visiting [place], [place], [place], [place] or [place]; or traveled in the [place] to [place], [place], [place], or [place]; in the last two weeks or you have been in close contact with a person diagnosed with COVID-19 in the last 2 weeks.   . Tell the health care staff about your symptoms: fever, cough and shortness of breath. . After you have been seen by a medical provider, you will be either: o Tested for (COVID-19) and discharged home on quarantine except to seek medical care if symptoms worsen, and asked to  - Stay home and avoid contact with others until you get your results (4-5 days)  - Avoid travel on public transportation if possible (such as bus, train, or airplane) or o Sent to the Emergency Department by EMS for evaluation, COVID-19 testing, and possible admission depending on your condition and test results.  What to do if you are LOW RISK for COVID-19?  Reduce your risk of any infection by using the same precautions used for avoiding the common cold or flu:  Marland Kitchen Wash your hands often with soap and warm water for at least 20 seconds.  If soap and water are not readily available, use an alcohol-based hand sanitizer with at least 60% alcohol.  . If coughing or  sneezing, cover your mouth and nose by coughing or sneezing into the elbow areas of your shirt or coat, into a tissue or into your sleeve (not your hands). . Avoid shaking hands with others and consider head nods or verbal greetings only. . Avoid touching your eyes, nose, or mouth with unwashed hands.  . Avoid close contact with people who are sick. . Avoid places or events with large numbers of people in one location, like concerts or  sporting events. . Carefully consider travel plans you have or are making. . If you are planning any travel outside or inside the [place], visit the CDC's Travelers' Health webpage for the latest health notices. . If you have some symptoms but not all symptoms, continue to monitor at home and seek medical attention if your symptoms worsen. . If you are having a medical emergency, call 911.   [facility] [facility] / e-Visit: [email or web address]         [facility]: [facility]: [phone]                   [facility]: [phone]

## 2018-07-01 ENCOUNTER — Inpatient Hospital Stay: Payer: Medicare Other

## 2018-07-01 ENCOUNTER — Other Ambulatory Visit: Payer: Self-pay

## 2018-07-01 DIAGNOSIS — Z5189 Encounter for other specified aftercare: Secondary | ICD-10-CM | POA: Diagnosis not present

## 2018-07-01 DIAGNOSIS — C3491 Malignant neoplasm of unspecified part of right bronchus or lung: Secondary | ICD-10-CM

## 2018-07-01 MED ORDER — PEGFILGRASTIM INJECTION 6 MG/0.6ML ~~LOC~~
PREFILLED_SYRINGE | SUBCUTANEOUS | Status: AC
  Filled 2018-07-01: qty 0.6

## 2018-07-01 MED ORDER — PEGFILGRASTIM INJECTION 6 MG/0.6ML ~~LOC~~
6.0000 mg | PREFILLED_SYRINGE | Freq: Once | SUBCUTANEOUS | Status: AC
  Administered 2018-07-01: 16:00:00 6 mg via SUBCUTANEOUS

## 2018-07-01 NOTE — Patient Instructions (Signed)
Pegfilgrastim injection  What is this medicine?  PEGFILGRASTIM (PEG fil gra stim) is a long-acting granulocyte colony-stimulating factor that stimulates the growth of neutrophils, a type of white blood cell important in the body's fight against infection. It is used to reduce the incidence of fever and infection in patients with certain types of cancer who are receiving chemotherapy that affects the bone marrow, and to increase survival after being exposed to high doses of radiation.  This medicine may be used for other purposes; ask your health care provider or pharmacist if you have questions.  COMMON BRAND NAME(S): Fulphila, Neulasta, UDENYCA  What should I tell my health care provider before I take this medicine?  They need to know if you have any of these conditions:  -kidney disease  -latex allergy  -ongoing radiation therapy  -sickle cell disease  -skin reactions to acrylic adhesives (On-Body Injector only)  -an unusual or allergic reaction to pegfilgrastim, filgrastim, other medicines, foods, dyes, or preservatives  -pregnant or trying to get pregnant  -breast-feeding  How should I use this medicine?  This medicine is for injection under the skin. If you get this medicine at home, you will be taught how to prepare and give the pre-filled syringe or how to use the On-body Injector. Refer to the patient Instructions for Use for detailed instructions. Use exactly as directed. Tell your healthcare provider immediately if you suspect that the On-body Injector may not have performed as intended or if you suspect the use of the On-body Injector resulted in a missed or partial dose.  It is important that you put your used needles and syringes in a special sharps container. Do not put them in a trash can. If you do not have a sharps container, call your pharmacist or healthcare provider to get one.  Talk to your pediatrician regarding the use of this medicine in children. While this drug may be prescribed for  selected conditions, precautions do apply.  Overdosage: If you think you have taken too much of this medicine contact a poison control center or emergency room at once.  NOTE: This medicine is only for you. Do not share this medicine with others.  What if I miss a dose?  It is important not to miss your dose. Call your doctor or health care professional if you miss your dose. If you miss a dose due to an On-body Injector failure or leakage, a new dose should be administered as soon as possible using a single prefilled syringe for manual use.  What may interact with this medicine?  Interactions have not been studied.  Give your health care provider a list of all the medicines, herbs, non-prescription drugs, or dietary supplements you use. Also tell them if you smoke, drink alcohol, or use illegal drugs. Some items may interact with your medicine.  This list may not describe all possible interactions. Give your health care provider a list of all the medicines, herbs, non-prescription drugs, or dietary supplements you use. Also tell them if you smoke, drink alcohol, or use illegal drugs. Some items may interact with your medicine.  What should I watch for while using this medicine?  You may need blood work done while you are taking this medicine.  If you are going to need a MRI, CT scan, or other procedure, tell your doctor that you are using this medicine (On-Body Injector only).  What side effects may I notice from receiving this medicine?  Side effects that you should report to   your doctor or health care professional as soon as possible:  -allergic reactions like skin rash, itching or hives, swelling of the face, lips, or tongue  -back pain  -dizziness  -fever  -pain, redness, or irritation at site where injected  -pinpoint red spots on the skin  -red or dark-brown urine  -shortness of breath or breathing problems  -stomach or side pain, or pain at the shoulder  -swelling  -tiredness  -trouble passing urine or  change in the amount of urine  Side effects that usually do not require medical attention (report to your doctor or health care professional if they continue or are bothersome):  -bone pain  -muscle pain  This list may not describe all possible side effects. Call your doctor for medical advice about side effects. You may report side effects to FDA at 1-800-FDA-1088.  Where should I keep my medicine?  Keep out of the reach of children.  If you are using this medicine at home, you will be instructed on how to store it. Throw away any unused medicine after the expiration date on the label.  NOTE: This sheet is a summary. It may not cover all possible information. If you have questions about this medicine, talk to your doctor, pharmacist, or health care provider.   2019 Elsevier/Gold Standard (2017-05-25 16:57:08)

## 2018-07-02 ENCOUNTER — Ambulatory Visit: Payer: Self-pay

## 2018-07-05 ENCOUNTER — Other Ambulatory Visit: Payer: Self-pay

## 2018-07-05 ENCOUNTER — Inpatient Hospital Stay: Payer: Medicare Other | Attending: Internal Medicine

## 2018-07-05 DIAGNOSIS — Z5112 Encounter for antineoplastic immunotherapy: Secondary | ICD-10-CM | POA: Insufficient documentation

## 2018-07-05 DIAGNOSIS — J9819 Other pulmonary collapse: Secondary | ICD-10-CM | POA: Diagnosis not present

## 2018-07-05 DIAGNOSIS — Z888 Allergy status to other drugs, medicaments and biological substances status: Secondary | ICD-10-CM | POA: Diagnosis not present

## 2018-07-05 DIAGNOSIS — J439 Emphysema, unspecified: Secondary | ICD-10-CM | POA: Diagnosis not present

## 2018-07-05 DIAGNOSIS — C787 Secondary malignant neoplasm of liver and intrahepatic bile duct: Secondary | ICD-10-CM | POA: Insufficient documentation

## 2018-07-05 DIAGNOSIS — M4854XA Collapsed vertebra, not elsewhere classified, thoracic region, initial encounter for fracture: Secondary | ICD-10-CM | POA: Diagnosis not present

## 2018-07-05 DIAGNOSIS — Z79899 Other long term (current) drug therapy: Secondary | ICD-10-CM | POA: Insufficient documentation

## 2018-07-05 DIAGNOSIS — Z881 Allergy status to other antibiotic agents status: Secondary | ICD-10-CM | POA: Insufficient documentation

## 2018-07-05 DIAGNOSIS — Z880 Allergy status to penicillin: Secondary | ICD-10-CM | POA: Diagnosis not present

## 2018-07-05 DIAGNOSIS — C7951 Secondary malignant neoplasm of bone: Secondary | ICD-10-CM | POA: Diagnosis not present

## 2018-07-05 DIAGNOSIS — C342 Malignant neoplasm of middle lobe, bronchus or lung: Secondary | ICD-10-CM | POA: Diagnosis not present

## 2018-07-05 DIAGNOSIS — Z5189 Encounter for other specified aftercare: Secondary | ICD-10-CM | POA: Diagnosis not present

## 2018-07-05 DIAGNOSIS — Z5111 Encounter for antineoplastic chemotherapy: Secondary | ICD-10-CM | POA: Insufficient documentation

## 2018-07-05 DIAGNOSIS — C3491 Malignant neoplasm of unspecified part of right bronchus or lung: Secondary | ICD-10-CM

## 2018-07-05 DIAGNOSIS — I700 Atherosclerosis of aorta: Secondary | ICD-10-CM | POA: Diagnosis not present

## 2018-07-05 DIAGNOSIS — Z882 Allergy status to sulfonamides status: Secondary | ICD-10-CM | POA: Insufficient documentation

## 2018-07-05 DIAGNOSIS — J302 Other seasonal allergic rhinitis: Secondary | ICD-10-CM | POA: Insufficient documentation

## 2018-07-05 DIAGNOSIS — Z885 Allergy status to narcotic agent status: Secondary | ICD-10-CM | POA: Diagnosis not present

## 2018-07-05 DIAGNOSIS — Z886 Allergy status to analgesic agent status: Secondary | ICD-10-CM | POA: Insufficient documentation

## 2018-07-05 LAB — CMP (CANCER CENTER ONLY)
ALT: 9 U/L (ref 0–44)
AST: 14 U/L — ABNORMAL LOW (ref 15–41)
Albumin: 3 g/dL — ABNORMAL LOW (ref 3.5–5.0)
Alkaline Phosphatase: 129 U/L — ABNORMAL HIGH (ref 38–126)
Anion gap: 9 (ref 5–15)
BUN: 18 mg/dL (ref 8–23)
CO2: 26 mmol/L (ref 22–32)
Calcium: 8 mg/dL — ABNORMAL LOW (ref 8.9–10.3)
Chloride: 106 mmol/L (ref 98–111)
Creatinine: 0.77 mg/dL (ref 0.44–1.00)
GFR, Est AFR Am: >60 mL/min (ref >60)
GFR, Estimated: >60 mL/min (ref >60)
Glucose, Bld: 100 mg/dL — ABNORMAL HIGH (ref 70–99)
Potassium: 4.6 mmol/L (ref 3.5–5.1)
Sodium: 141 mmol/L (ref 135–145)
Total Bilirubin: 0.3 mg/dL (ref 0.3–1.2)
Total Protein: 5.8 g/dL — ABNORMAL LOW (ref 6.5–8.1)

## 2018-07-05 LAB — CBC WITH DIFFERENTIAL (CANCER CENTER ONLY)
Abs Immature Granulocytes: 0.3 10*3/uL — ABNORMAL HIGH (ref 0.00–0.07)
Band Neutrophils: 3 %
Basophils Absolute: 0 10*3/uL (ref 0.0–0.1)
Basophils Relative: 0 %
Eosinophils Absolute: 0 10*3/uL (ref 0.0–0.5)
Eosinophils Relative: 0 %
HCT: 26.8 % — ABNORMAL LOW (ref 36.0–46.0)
Hemoglobin: 8.1 g/dL — ABNORMAL LOW (ref 12.0–15.0)
Lymphocytes Relative: 4 %
Lymphs Abs: 1 10*3/uL (ref 0.7–4.0)
MCH: 31.6 pg (ref 26.0–34.0)
MCHC: 30.2 g/dL (ref 30.0–36.0)
MCV: 104.7 fL — ABNORMAL HIGH (ref 80.0–100.0)
Metamyelocytes Relative: 1 %
Monocytes Absolute: 0 10*3/uL — ABNORMAL LOW (ref 0.1–1.0)
Monocytes Relative: 0 %
Neutro Abs: 24.3 10*3/uL — ABNORMAL HIGH (ref 1.7–17.7)
Neutrophils Relative %: 92 %
Platelet Count: 269 10*3/uL (ref 150–400)
RBC: 2.56 MIL/uL — ABNORMAL LOW (ref 3.87–5.11)
RDW: 15.7 % — ABNORMAL HIGH (ref 11.5–15.5)
WBC Count: 25.6 10*3/uL — ABNORMAL HIGH (ref 4.0–10.5)
nRBC: 0 % (ref 0.0–0.2)

## 2018-07-12 ENCOUNTER — Other Ambulatory Visit: Payer: Self-pay

## 2018-07-12 ENCOUNTER — Inpatient Hospital Stay: Payer: Medicare Other

## 2018-07-12 DIAGNOSIS — C3491 Malignant neoplasm of unspecified part of right bronchus or lung: Secondary | ICD-10-CM

## 2018-07-12 DIAGNOSIS — C342 Malignant neoplasm of middle lobe, bronchus or lung: Secondary | ICD-10-CM | POA: Diagnosis not present

## 2018-07-12 LAB — CMP (CANCER CENTER ONLY)
ALT: 14 U/L (ref 0–44)
AST: 15 U/L (ref 15–41)
Albumin: 3.1 g/dL — ABNORMAL LOW (ref 3.5–5.0)
Alkaline Phosphatase: 124 U/L (ref 38–126)
Anion gap: 10 (ref 5–15)
BUN: 12 mg/dL (ref 8–23)
CO2: 25 mmol/L (ref 22–32)
Calcium: 8 mg/dL — ABNORMAL LOW (ref 8.9–10.3)
Chloride: 107 mmol/L (ref 98–111)
Creatinine: 1.15 mg/dL — ABNORMAL HIGH (ref 0.44–1.00)
GFR, Est AFR Am: 53 mL/min — ABNORMAL LOW (ref >60)
GFR, Estimated: 46 mL/min — ABNORMAL LOW (ref >60)
Glucose, Bld: 100 mg/dL — ABNORMAL HIGH (ref 70–99)
Potassium: 4.3 mmol/L (ref 3.5–5.1)
Sodium: 142 mmol/L (ref 135–145)
Total Bilirubin: <0.2 mg/dL — ABNORMAL LOW (ref 0.3–1.2)
Total Protein: 6 g/dL — ABNORMAL LOW (ref 6.5–8.1)

## 2018-07-12 LAB — CBC WITH DIFFERENTIAL (CANCER CENTER ONLY)
Abs Immature Granulocytes: 0.95 10*3/uL — ABNORMAL HIGH (ref 0.00–0.07)
Basophils Absolute: 0.2 10*3/uL — ABNORMAL HIGH (ref 0.0–0.1)
Basophils Relative: 1 %
Eosinophils Absolute: 0.1 10*3/uL (ref 0.0–0.5)
Eosinophils Relative: 0 %
HCT: 28.3 % — ABNORMAL LOW (ref 36.0–46.0)
Hemoglobin: 8.7 g/dL — ABNORMAL LOW (ref 12.0–15.0)
Immature Granulocytes: 4 %
Lymphocytes Relative: 5 %
Lymphs Abs: 1.1 10*3/uL (ref 0.7–4.0)
MCH: 32.6 pg (ref 26.0–34.0)
MCHC: 30.7 g/dL (ref 30.0–36.0)
MCV: 106 fL — ABNORMAL HIGH (ref 80.0–100.0)
Monocytes Absolute: 0.8 10*3/uL (ref 0.1–1.0)
Monocytes Relative: 4 %
Neutro Abs: 19.2 10*3/uL — ABNORMAL HIGH (ref 1.7–7.7)
Neutrophils Relative %: 86 %
Platelet Count: 140 10*3/uL — ABNORMAL LOW (ref 150–400)
RBC: 2.67 MIL/uL — ABNORMAL LOW (ref 3.87–5.11)
RDW: 17.5 % — ABNORMAL HIGH (ref 11.5–15.5)
WBC Count: 22.2 10*3/uL — ABNORMAL HIGH (ref 4.0–10.5)
nRBC: 0.2 % (ref 0.0–0.2)

## 2018-07-12 LAB — TSH: TSH: 0.646 u[IU]/mL (ref 0.308–3.960)

## 2018-07-19 ENCOUNTER — Other Ambulatory Visit: Payer: Self-pay

## 2018-07-19 ENCOUNTER — Encounter: Payer: Self-pay | Admitting: Physician Assistant

## 2018-07-19 ENCOUNTER — Inpatient Hospital Stay: Payer: Medicare Other

## 2018-07-19 ENCOUNTER — Inpatient Hospital Stay (HOSPITAL_BASED_OUTPATIENT_CLINIC_OR_DEPARTMENT_OTHER): Payer: Medicare Other | Admitting: Physician Assistant

## 2018-07-19 VITALS — BP 104/44 | HR 83 | Temp 98.3°F | Resp 17 | Ht 64.0 in | Wt 141.0 lb

## 2018-07-19 DIAGNOSIS — C787 Secondary malignant neoplasm of liver and intrahepatic bile duct: Secondary | ICD-10-CM | POA: Diagnosis not present

## 2018-07-19 DIAGNOSIS — Z5111 Encounter for antineoplastic chemotherapy: Secondary | ICD-10-CM

## 2018-07-19 DIAGNOSIS — J3089 Other allergic rhinitis: Secondary | ICD-10-CM

## 2018-07-19 DIAGNOSIS — J302 Other seasonal allergic rhinitis: Secondary | ICD-10-CM | POA: Diagnosis not present

## 2018-07-19 DIAGNOSIS — M4854XA Collapsed vertebra, not elsewhere classified, thoracic region, initial encounter for fracture: Secondary | ICD-10-CM

## 2018-07-19 DIAGNOSIS — Z880 Allergy status to penicillin: Secondary | ICD-10-CM

## 2018-07-19 DIAGNOSIS — Z886 Allergy status to analgesic agent status: Secondary | ICD-10-CM

## 2018-07-19 DIAGNOSIS — C7951 Secondary malignant neoplasm of bone: Secondary | ICD-10-CM | POA: Diagnosis not present

## 2018-07-19 DIAGNOSIS — C342 Malignant neoplasm of middle lobe, bronchus or lung: Secondary | ICD-10-CM | POA: Diagnosis not present

## 2018-07-19 DIAGNOSIS — Z881 Allergy status to other antibiotic agents status: Secondary | ICD-10-CM

## 2018-07-19 DIAGNOSIS — Z885 Allergy status to narcotic agent status: Secondary | ICD-10-CM

## 2018-07-19 DIAGNOSIS — C3491 Malignant neoplasm of unspecified part of right bronchus or lung: Secondary | ICD-10-CM

## 2018-07-19 DIAGNOSIS — Z5112 Encounter for antineoplastic immunotherapy: Secondary | ICD-10-CM

## 2018-07-19 DIAGNOSIS — Z888 Allergy status to other drugs, medicaments and biological substances status: Secondary | ICD-10-CM

## 2018-07-19 DIAGNOSIS — Z882 Allergy status to sulfonamides status: Secondary | ICD-10-CM

## 2018-07-19 DIAGNOSIS — I700 Atherosclerosis of aorta: Secondary | ICD-10-CM

## 2018-07-19 DIAGNOSIS — J439 Emphysema, unspecified: Secondary | ICD-10-CM

## 2018-07-19 DIAGNOSIS — J9819 Other pulmonary collapse: Secondary | ICD-10-CM

## 2018-07-19 LAB — CMP (CANCER CENTER ONLY)
ALT: 11 U/L (ref 0–44)
AST: 15 U/L (ref 15–41)
Albumin: 2.9 g/dL — ABNORMAL LOW (ref 3.5–5.0)
Alkaline Phosphatase: 87 U/L (ref 38–126)
Anion gap: 9 (ref 5–15)
BUN: 13 mg/dL (ref 8–23)
CO2: 26 mmol/L (ref 22–32)
Calcium: 8.2 mg/dL — ABNORMAL LOW (ref 8.9–10.3)
Chloride: 108 mmol/L (ref 98–111)
Creatinine: 0.95 mg/dL (ref 0.44–1.00)
GFR, Est AFR Am: >60 mL/min (ref >60)
GFR, Estimated: 57 mL/min — ABNORMAL LOW (ref >60)
Glucose, Bld: 87 mg/dL (ref 70–99)
Potassium: 3.8 mmol/L (ref 3.5–5.1)
Sodium: 143 mmol/L (ref 135–145)
Total Bilirubin: 0.2 mg/dL — ABNORMAL LOW (ref 0.3–1.2)
Total Protein: 5.6 g/dL — ABNORMAL LOW (ref 6.5–8.1)

## 2018-07-19 LAB — CBC WITH DIFFERENTIAL (CANCER CENTER ONLY)
Abs Immature Granulocytes: 0.66 10*3/uL — ABNORMAL HIGH (ref 0.00–0.07)
Basophils Absolute: 0.1 10*3/uL (ref 0.0–0.1)
Basophils Relative: 1 %
Eosinophils Absolute: 0.1 10*3/uL (ref 0.0–0.5)
Eosinophils Relative: 1 %
HCT: 30 % — ABNORMAL LOW (ref 36.0–46.0)
Hemoglobin: 9 g/dL — ABNORMAL LOW (ref 12.0–15.0)
Immature Granulocytes: 4 %
Lymphocytes Relative: 8 %
Lymphs Abs: 1.3 10*3/uL (ref 0.7–4.0)
MCH: 32.6 pg (ref 26.0–34.0)
MCHC: 30 g/dL (ref 30.0–36.0)
MCV: 108.7 fL — ABNORMAL HIGH (ref 80.0–100.0)
Monocytes Absolute: 1.2 10*3/uL — ABNORMAL HIGH (ref 0.1–1.0)
Monocytes Relative: 7 %
Neutro Abs: 14.3 10*3/uL — ABNORMAL HIGH (ref 1.7–7.7)
Neutrophils Relative %: 79 %
Platelet Count: 368 10*3/uL (ref 150–400)
RBC: 2.76 MIL/uL — ABNORMAL LOW (ref 3.87–5.11)
RDW: 18 % — ABNORMAL HIGH (ref 11.5–15.5)
WBC Count: 17.7 10*3/uL — ABNORMAL HIGH (ref 4.0–10.5)
nRBC: 0 % (ref 0.0–0.2)

## 2018-07-19 MED ORDER — PALONOSETRON HCL INJECTION 0.25 MG/5ML
0.2500 mg | Freq: Once | INTRAVENOUS | Status: AC
  Administered 2018-07-19: 0.25 mg via INTRAVENOUS

## 2018-07-19 MED ORDER — HEPARIN SOD (PORK) LOCK FLUSH 100 UNIT/ML IV SOLN
500.0000 [IU] | Freq: Once | INTRAVENOUS | Status: AC | PRN
  Administered 2018-07-19: 16:00:00 500 [IU]
  Filled 2018-07-19: qty 5

## 2018-07-19 MED ORDER — SODIUM CHLORIDE 0.9 % IV SOLN
Freq: Once | INTRAVENOUS | Status: AC
  Administered 2018-07-19: 12:00:00 via INTRAVENOUS
  Filled 2018-07-19: qty 250

## 2018-07-19 MED ORDER — PALONOSETRON HCL INJECTION 0.25 MG/5ML
INTRAVENOUS | Status: AC
  Filled 2018-07-19: qty 5

## 2018-07-19 MED ORDER — SODIUM CHLORIDE 0.9 % IV SOLN
291.2000 mg | Freq: Once | INTRAVENOUS | Status: AC
  Administered 2018-07-19: 290 mg via INTRAVENOUS
  Filled 2018-07-19: qty 29

## 2018-07-19 MED ORDER — SODIUM CHLORIDE 0.9 % IV SOLN: 364.0000 mg | Freq: Once | INTRAVENOUS | Status: DC

## 2018-07-19 MED ORDER — SODIUM CHLORIDE 0.9 % IV SOLN
80.0000 mg/m2 | Freq: Once | INTRAVENOUS | Status: AC
  Administered 2018-07-19: 14:00:00 140 mg via INTRAVENOUS
  Filled 2018-07-19: qty 7

## 2018-07-19 MED ORDER — SODIUM CHLORIDE 0.9 % IV SOLN
1200.0000 mg | Freq: Once | INTRAVENOUS | Status: AC
  Administered 2018-07-19: 1200 mg via INTRAVENOUS
  Filled 2018-07-19: qty 20

## 2018-07-19 MED ORDER — SODIUM CHLORIDE 0.9% FLUSH
10.0000 mL | INTRAVENOUS | Status: DC | PRN
  Administered 2018-07-19: 10 mL
  Filled 2018-07-19: qty 10

## 2018-07-19 MED ORDER — SODIUM CHLORIDE 0.9 % IV SOLN
Freq: Once | INTRAVENOUS | Status: AC
  Administered 2018-07-19: 12:00:00 via INTRAVENOUS
  Filled 2018-07-19: qty 5

## 2018-07-19 NOTE — Progress Notes (Signed)
[name]  Sinda Du, MD [address] [place] [address]  DIAGNOSIS: Extensive stage (T2b, N3, M1c)small cell lung cancer presented with right middle lobe lung mass in addition to right hilar and mediastinal lymphadenopathy as well as metastatic disease to the bone and liver diagnosed in February 2020.  PRIOR THERAPY: None  CURRENT THERAPY: Dose reduced carboplatin for AUC of4on day 1, etoposide 80mg /M2 on days 1, 2 and 3 with Tecentriq 1200 mg IV every 3 weeks with Neulasta support. First dose 05/17/2018. Status post 3 cycles.  INTERVAL HISTORY: [name] 78 y.o. female returns to the clinic for a follow-up visit.  The patient is feeling fairly well today with no concerning complaints except for seasonal allergies.  The patient is experiencing some nasal congestion which is relieved with antihistamines and a productive cough and postnasal drainage. She denies any fever, chills, or night sweats.  She denies any worsening dyspnea on exertion denies any chest pain or hemoptysis.  Denies any weight loss. Denies sore throat. She tolerated her last treatment fairly well except she reports mild nausea following treatment but denies any vomiting, diarrhea, or constipation.  She also endorses generalized weakness with treatment.  She denies any headache or visual changes.  Denies any rashes or skin changes.  She is here today for evaluation before starting cycle #4.  MEDICAL HISTORY: Past Medical History:  Diagnosis Date  . COPD (chronic obstructive pulmonary disease) ([facility])   . Dyspnea   . GERD (gastroesophageal reflux disease)   . On home oxygen therapy   . Tachycardia     ALLERGIES:  is allergic to morphine; oxycodone-acetaminophen; celecoxib; ciprofloxacin; codeine; iodine; levaquin  [levofloxacin in d5w]; lisinopril; losartan; midazolam; nsaids; penicillins; statins; atorvastatin; citalopram; colesevelam; fenofibrate; tramadol hcl;  aleve [naproxen sodium]; azithromycin; cyclobenzaprine; erythromycin; fosamax [alendronate]; meloxicam; moxifloxacin; and sulfonamide derivatives.  MEDICATIONS:  Current Outpatient Medications  Medication Sig Dispense Refill  . acetaminophen (TYLENOL) 325 MG tablet Take by mouth. arthritis    . albuterol (PROVENTIL HFA;VENTOLIN HFA) 108 (90 Base) MCG/ACT inhaler Inhale into the lungs every 6 (six) hours as needed for wheezing or shortness of breath.    . Azelastine HCl 0.15 % SOLN 1-2 sprays each nostril 2 times per day as needed. 30 mL 5  . budesonide-formoterol (SYMBICORT) 160-4.5 MCG/ACT inhaler Inhale 2 puffs into the lungs 2 (two) times daily.    . carvedilol (COREG) 6.25 MG tablet Take 1 tablet (6.25 mg total) by mouth 2 (two) times daily with a meal. 180 tablet 3  . diphenhydrAMINE (BENADRYL) 25 MG tablet Take by mouth. As needed for allergies    . gabapentin (NEURONTIN) 300 MG capsule Take 300 mg by mouth 2 (two) times daily.   2  . ipratropium (ATROVENT) 0.02 % nebulizer solution Take 0.5 mg by nebulization 4 (four) times daily as needed for wheezing or shortness of breath.    . losartan (COZAAR) 25 MG tablet Take 1 tablet (25 mg total) by mouth daily. 90 tablet 3  . omeprazole (PRILOSEC) 20 MG capsule Take by mouth.    . OXYGEN Inhale 2-3 L into the lungs.    . pravastatin (PRAVACHOL) 80 MG tablet TAKE ONE TABLET BY MOUTH ONCE DAILY IN THE EVENING    . predniSONE (DELTASONE) 10 MG tablet Take 10 mg by mouth daily.    . predniSONE (DELTASONE) 5 MG tablet 6 tab x 1 day, 5 tab x 1 day, 4 tab x 1 day, 3 tab x  1 day, 2 tab x 1 day, 1 tab x 1 day, stop 21 tablet 0  . prochlorperazine (COMPAZINE) 10 MG tablet Take 1 tablet (10 mg total) by mouth every 6 (six) hours as needed for nausea or vomiting. 30 tablet 0  . torsemide (DEMADEX) 20 MG tablet TAKE 20 MG DAILY MAY TAKE ADDITIONAL 20 MG AS NEEDED FOR SWELLING 180 tablet 1  . triamcinolone (NASACORT) 55 MCG/ACT AERO nasal inhaler Place into  the nose.    . lidocaine-prilocaine (EMLA) cream Apply 1 application topically as needed. Apply on skin over port site 1-2 hours prior to treatment.  Cove with plastic wrap. (Patient not taking: Reported on 05/21/2018) 30 g 0   No current facility-administered medications for this visit.    Facility-Administered Medications Ordered in Other Visits  Medication Dose Route Frequency Provider Last Rate Last Dose  . CARBOplatin (PARAPLATIN) 290 mg in sodium chloride 0.9 % 250 mL chemo infusion  290 mg Intravenous Once Curt Bears, MD      . etoposide (VEPESID) 140 mg in sodium chloride 0.9 % 500 mL chemo infusion  80 mg/m2 (Treatment Plan Recorded) Intravenous Once Curt Bears, MD      . heparin lock flush 100 unit/mL  500 Units Intracatheter Once PRN Curt Bears, MD      . sodium chloride flush (NS) 0.9 % injection 10 mL  10 mL Intracatheter PRN Curt Bears, MD        SURGICAL HISTORY:  Past Surgical History:  Procedure Laterality Date  . APPENDECTOMY    . arthroscopc     total knee  . BACK SURGERY    . FOOT SURGERY    . IR IMAGING GUIDED PORT INSERTION  05/25/2018  . knee surgery bilateral    . PARTIAL HYSTERECTOMY    . ruptured disk      REVIEW OF SYSTEMS:   Review of Systems  Constitutional: Positive for fatigue and generalized weakness. Negative for appetite change, chills, fever and unexpected weight change.  HENT: Positive for nasal congestion. Negative for mouth sores, nosebleeds, sore throat and trouble swallowing.   Eyes: Negative for eye problems and icterus.  Respiratory: Positive for productive cough and shortness of breath with exertion. Negative for hemoptysis and wheezing.   Cardiovascular: Negative for chest pain and leg swelling.  Gastrointestinal: Positive for mild nausea with treatment. Negative for abdominal pain, constipation, diarrhea, and vomiting.  Genitourinary: Negative for bladder incontinence, difficulty urinating, dysuria, frequency and  hematuria.   Musculoskeletal: Negative for back pain, gait problem, neck pain and neck stiffness.  Skin: Negative for itching and rash.  Neurological: Negative for dizziness, extremity weakness, gait problem, headaches, light-headedness and seizures.  Hematological: Negative for adenopathy. Does not bruise/bleed easily.  Psychiatric/Behavioral: Negative for confusion, depression and sleep disturbance. The patient is not nervous/anxious.     PHYSICAL EXAMINATION:  Blood pressure (!) 104/44, pulse 83, temperature 98.3 F (36.8 C), temperature source Oral, resp. rate 17, height 5\' 4"  (1.626 m), weight 141 lb (64 kg), SpO2 98 %.  ECOG PERFORMANCE STATUS: 1 - Symptomatic but completely ambulatory  Physical Exam  Constitutional: Oriented to person, place, and time and well-developed, well-nourished, and in no distress.  HENT:  Head: Normocephalic and atraumatic.  Mouth/Throat: Oropharynx is clear and moist. No oropharyngeal exudate.  Eyes: Conjunctivae are normal. Right eye exhibits no discharge. Left eye exhibits no discharge. No scleral icterus.  Neck: Normal range of motion. Neck supple.  Cardiovascular: Normal rate, regular rhythm, normal heart sounds and intact  distal pulses.   Pulmonary/Chest: Effort normal and breath sounds normal. No respiratory distress. No wheezes. No rales.  Abdominal: Soft. Bowel sounds are normal. Exhibits no distension and no mass. There is no tenderness.  Musculoskeletal: Normal range of motion. Exhibits no edema.  Lymphadenopathy:    No cervical adenopathy.  Neurological: Alert and oriented to person, place, and time. Exhibits normal muscle tone. Gait normal. Coordination normal.  Skin: Skin is warm and dry. No rash noted. Not diaphoretic. No erythema. No pallor.  Psychiatric: Mood, memory and judgment normal.  Vitals reviewed.  LABORATORY DATA: Lab Results  Component Value Date   WBC 17.7 (H) 07/19/2018   HGB 9.0 (L) 07/19/2018   HCT 30.0 (L)  07/19/2018   MCV 108.7 (H) 07/19/2018   PLT 368 07/19/2018      Chemistry      Component Value Date/Time   NA 143 07/19/2018 1002   K 3.8 07/19/2018 1002   CL 108 07/19/2018 1002   CO2 26 07/19/2018 1002   BUN 13 07/19/2018 1002   CREATININE 0.95 07/19/2018 1002      Component Value Date/Time   CALCIUM 8.2 (L) 07/19/2018 1002   ALKPHOS 87 07/19/2018 1002   AST 15 07/19/2018 1002   ALT 11 07/19/2018 1002   BILITOT 0.2 (L) 07/19/2018 1002       RADIOGRAPHIC STUDIES:  Ct Abdomen Pelvis Wo Contrast  Result Date: 06/25/2018 CLINICAL DATA:  Right lung cancer diagnosed in February 2020. Chemotherapy ongoing. Previous appendectomy and hysterectomy. EXAM: CT CHEST, ABDOMEN AND PELVIS WITHOUT CONTRAST TECHNIQUE: Multidetector CT imaging of the chest, abdomen and pelvis was performed following the standard protocol without IV contrast. COMPARISON:  Chest CT 04/07/2018.  PET-CT 04/22/2018. 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 No acute vascular findings are seen on noncontrast imaging. Right IJ Port-A-Cath extends to the superior cavoatrial junction. There is stable mild cardiomegaly and a small pericardial effusion. Mediastinum/Nodes: Although suboptimally evaluated without contrast, the previously demonstrated enlarged mediastinal and right hilar lymph nodes have resolved. Currently, no enlarged mediastinal, hilar or axillary lymph nodes are identified. The thyroid gland is mildly enlarged and nodular, although appears stable. The trachea and esophagus demonstrate no significant findings. Lungs/Pleura: There is a trace left pleural effusion. Moderate centrilobular emphysema and diffuse central airway thickening are again noted. 6 x 4 mm right upper lobe nodule on image 35/4 is stable. Nodularity in the right lower lobe has improved. The right  middle lobe remains collapsed. On the left, there is new collapse of the left lower lobe with associated consolidation. No suspicious left upper lobe nodularity. Musculoskeletal/Chest wall: No chest wall mass. There is a stable compression deformity at T9. There is a new T12 compression deformity compared with the prior chest CT. Both fractures are associated with mild osseous retropulsion. No epidural tumor or focal lytic lesion identified. There is multilevel spondylosis. CT ABDOMEN AND PELVIS FINDINGS Hepatobiliary: Multifocal hepatic metastatic disease is again noted, not optimally evaluated without contrast and best seen on narrow windows. Overall appearance appears slightly improved compared with the PET-CT. For example, there is a 16 mm lesion in the left lobe (image 52/2) which previously measured 20 mm. No definite enlarging lesions. No evidence of gallstones, gallbladder wall thickening or biliary dilatation. Pancreas: Unremarkable. No pancreatic ductal dilatation or surrounding inflammatory changes. Spleen: Normal in size without focal abnormality. Adrenals/Urinary Tract: Both adrenal glands appear normal.  The kidneys, ureters and bladder appear unremarkable. No evidence of hydronephrosis. Stomach/Bowel: No evidence of bowel wall thickening, distention or surrounding inflammatory change. The stomach is incompletely distended. There are mild diverticular changes within the descending and sigmoid colon. Vascular/Lymphatic: There are no enlarged abdominal or pelvic lymph nodes. 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 Reproductive: Hysterectomy. No adnexal mass. Mild pelvic floor laxity. Other: Intact anterior abdominal wall. No ascites or peritoneal nodularity. Musculoskeletal: Bilateral sacral sclerosis suspicious for insufficiency fractures. Alternatively, this could be related to interval radiation therapy if appropriate. No  focal lytic lesion or pathologic fracture identified. There are degenerative and postsurgical changes in the lower lumbar spine. IMPRESSION: 1. Interval resolution of mediastinal and right hilar adenopathy. 2. Right lower lobe pulmonary nodularity has improved. There is new complete collapse of the left lower lobe, obscuring the previously demonstrated nodularity. Right middle lobe collapse and a small right upper lobe nodule are unchanged. 3. The hepatic metastatic disease appears mildly improved, although suboptimally evaluated without contrast. No disease progression identified. 4. The multifocal osseous metastases seen on PET-CT are not well visualized. New T12 compression fracture and suspected bilateral sacral insufficiency fractures. 5. Aortic Atherosclerosis (ICD10-I70.0) and Emphysema (ICD10-J43.9). 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 Electronically Signed   By: Richardean Sale M.D.   On: 06/25/2018 17:07   Ct Chest Wo Contrast  Result Date: 06/25/2018 CLINICAL DATA:  Right lung cancer diagnosed in February 2020. Chemotherapy ongoing. Previous appendectomy and hysterectomy. EXAM: CT CHEST, ABDOMEN AND PELVIS WITHOUT CONTRAST TECHNIQUE: Multidetector CT imaging of the chest, abdomen and pelvis was performed following the standard protocol without IV contrast. COMPARISON:  Chest CT 04/07/2018.  PET-CT 04/22/2018. 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 No acute vascular findings are seen on noncontrast imaging. Right IJ Port-A-Cath extends to the superior cavoatrial junction. There is stable mild cardiomegaly and a small pericardial effusion. Mediastinum/Nodes: Although suboptimally evaluated without contrast, the previously demonstrated enlarged mediastinal and right hilar lymph nodes have resolved. Currently, no enlarged  mediastinal, hilar or axillary lymph nodes are identified. The thyroid gland is mildly enlarged and nodular, although appears stable. The trachea and esophagus demonstrate no significant findings. Lungs/Pleura: There is a trace left pleural effusion. Moderate centrilobular emphysema and diffuse central airway thickening are again noted. 6 x 4 mm right upper lobe nodule on image 35/4 is stable. Nodularity in the right lower lobe has improved. The right middle lobe remains collapsed. On the left, there is new collapse of the left lower lobe with associated consolidation. No suspicious left upper lobe nodularity. Musculoskeletal/Chest wall: No chest wall mass. There is a stable compression deformity at T9. There is a new T12 compression deformity compared with the prior chest CT. Both fractures are associated with mild osseous retropulsion. No epidural tumor or focal lytic lesion identified. There is multilevel spondylosis. CT ABDOMEN AND PELVIS FINDINGS Hepatobiliary: Multifocal hepatic metastatic disease is again noted, not optimally evaluated without contrast and best seen on narrow windows. Overall appearance appears slightly improved compared with the PET-CT. For example, there is a 16 mm lesion in the left lobe (image 52/2) which previously measured 20 mm. No definite enlarging lesions. No evidence of gallstones, gallbladder wall thickening or biliary dilatation. Pancreas: Unremarkable. No pancreatic ductal dilatation or surrounding inflammatory changes. Spleen: Normal in size without focal abnormality. Adrenals/Urinary Tract: Both  adrenal glands appear normal. The kidneys, ureters and bladder appear unremarkable. No evidence of hydronephrosis. Stomach/Bowel: No evidence of bowel wall thickening, distention or surrounding inflammatory change. The stomach is incompletely distended. There are mild diverticular changes within the descending and sigmoid colon. Vascular/Lymphatic: There are no enlarged abdominal or  pelvic lymph nodes. 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 Reproductive: Hysterectomy. No adnexal mass. Mild pelvic floor laxity. Other: Intact anterior abdominal wall. No ascites or peritoneal nodularity. Musculoskeletal: Bilateral sacral sclerosis suspicious for insufficiency fractures. Alternatively, this could be related to interval radiation therapy if appropriate. No focal lytic lesion or pathologic fracture identified. There are degenerative and postsurgical changes in the lower lumbar spine. IMPRESSION: 1. Interval resolution of mediastinal and right hilar adenopathy. 2. Right lower lobe pulmonary nodularity has improved. There is new complete collapse of the left lower lobe, obscuring the previously demonstrated nodularity. Right middle lobe collapse and a small right upper lobe nodule are unchanged. 3. The hepatic metastatic disease appears mildly improved, although suboptimally evaluated without contrast. No disease progression identified. 4. The multifocal osseous metastases seen on PET-CT are not well visualized. New T12 compression fracture and suspected bilateral sacral insufficiency fractures. 5. Aortic Atherosclerosis (ICD10-I70.0) and Emphysema (ICD10-J43.9). 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 Electronically Signed   By: Richardean Sale M.D.   On: 06/25/2018 17:07     ASSESSMENT/PLAN:  This is a very pleasant 78 year old Caucasian female recently diagnosed with extensive stage (T2b, N3, M1C) small cell lung cancer.  She presented with a right middle lobe lung mass in addition to right hilar and mediastinal lymphadenopathy.  She also has 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  She was diagnosed in February 2020.  She is currently undergoing treatment with carboplatin for an AUC 4 on day 1, etoposide 80 mg/m on days 1, 2, and 3 with Tecentriq 1200 grams IV every 3 weeks with Neulasta  support.  She is on a reduced dose of her carboplatin due to her poor general status and risk for infection during this time.  She is status post 3 cycles.  She tolerated her last treatment fairly well except for generalized weakness and mild nausea.   The patient was seen with Dr. Julien Nordmann today.  Labs were reviewed with the patient.  We recommend that she proceed with cycle #4 today scheduled.  I will arrange for restaging CT scan of the chest abdomen and pelvis before her next visit.  The patient will receive her scan without contrast due to her iodine allergy.   We will see her back for a follow-up visit in 3 weeks for evaluation and to review her scan results before starting cycle #5.  The patient was advised to continue taking antihistamines for her nasal congestion and seasonal allergies.  The patient was advised to call immediately if she has any concerning symptoms in the interval. The patient voices understanding of current disease status and treatment options and is in agreement with the current care plan. All questions were answered. The patient knows to call the clinic with any problems, questions or concerns. We can certainly see the patient much sooner if necessary   Orders Placed This Encounter  Procedures  . CT Chest Wo Contrast    Standing Status:   Future    Standing Expiration Date:   07/19/2019    Order Specific Question:   ** REASON FOR EXAM (FREE TEXT)    Answer:  Restaging Lung Cancer    Order Specific Question:   Preferred imaging location?    Answer:   [facility]    Order Specific Question:   Radiology Contrast Protocol - do NOT remove file path    Answer:   [email or web address]  . CT Abdomen Pelvis Wo Contrast    Standing Status:   Future    Standing Expiration Date:   07/19/2019    Order Specific Question:   ** REASON FOR EXAM (FREE TEXT)    Answer:   Restaging Lung Cancer    Order Specific Question:   Preferred imaging  location?    Answer:   [facility]    Order Specific Question:   Is Oral Contrast requested for this exam?    Answer:   Yes, Per Radiology protocol    Order Specific Question:   Radiology Contrast Protocol - do NOT remove file path    Answer:   [email or web address]     Edelmiro Innocent L Kasten Leveque, PA-C 07/19/18  ADDENDUM: Hematology/Oncology Attending: I had a face-to-face encounter with the patient today.  I recommended her care plan.  This is a very pleasant 78 years old with extensive stage small cell lung cancer.  She is currently undergoing systemic chemotherapy with carboplatin, etoposide and Tecentriq status post 3 cycles.  The patient continues to tolerate this treatment well except for fatigue. I recommended for the patient to proceed with cycle #4 today as planned. We will arrange for the patient to have repeat CT scan of the chest, abdomen and pelvis before her upcoming visit 3 weeks. The patient was advised to call immediately if she has any concerning symptoms in the interval.  Disclaimer: This note was dictated with voice recognition software. Similar sounding words can inadvertently be transcribed and may be missed upon review. Eilleen Kempf, MD 07/19/18

## 2018-07-19 NOTE — Patient Instructions (Signed)
[facility] Discharge Instructions for Patients Receiving Chemotherapy  Today you received the following chemotherapy agents: Tecentriq, Carboplatin, Etoposide.  To help prevent nausea and vomiting after your treatment, we encourage you to take your nausea medication as prescribed.   If you develop nausea and vomiting that is not controlled by your nausea medication, call the clinic.   BELOW ARE SYMPTOMS THAT SHOULD BE REPORTED IMMEDIATELY:  *FEVER GREATER THAN 100.5 F  *CHILLS WITH OR WITHOUT FEVER  NAUSEA AND VOMITING THAT IS NOT CONTROLLED WITH YOUR NAUSEA MEDICATION  *UNUSUAL SHORTNESS OF BREATH  *UNUSUAL BRUISING OR BLEEDING  TENDERNESS IN MOUTH AND THROAT WITH OR WITHOUT PRESENCE OF ULCERS  *URINARY PROBLEMS  *BOWEL PROBLEMS  UNUSUAL RASH Items with * indicate a potential emergency and should be followed up as soon as possible.  Feel free to call the clinic should you have any questions or concerns. The clinic phone number is [phone].  Please show the [facility] at check-in to the Emergency Department and triage nurse.

## 2018-07-20 ENCOUNTER — Other Ambulatory Visit: Payer: Self-pay

## 2018-07-20 ENCOUNTER — Telehealth: Payer: Self-pay | Admitting: Physician Assistant

## 2018-07-20 ENCOUNTER — Inpatient Hospital Stay: Payer: Medicare Other

## 2018-07-20 VITALS — BP 138/85 | HR 80 | Temp 98.5°F | Resp 18

## 2018-07-20 DIAGNOSIS — C342 Malignant neoplasm of middle lobe, bronchus or lung: Secondary | ICD-10-CM | POA: Diagnosis not present

## 2018-07-20 DIAGNOSIS — C3491 Malignant neoplasm of unspecified part of right bronchus or lung: Secondary | ICD-10-CM

## 2018-07-20 MED ORDER — DEXAMETHASONE SODIUM PHOSPHATE 10 MG/ML IJ SOLN
10.0000 mg | Freq: Once | INTRAMUSCULAR | Status: AC
  Administered 2018-07-20: 10 mg via INTRAVENOUS

## 2018-07-20 MED ORDER — HEPARIN SOD (PORK) LOCK FLUSH 100 UNIT/ML IV SOLN
500.0000 [IU] | Freq: Once | INTRAVENOUS | Status: AC | PRN
  Administered 2018-07-20: 16:00:00 500 [IU]
  Filled 2018-07-20: qty 5

## 2018-07-20 MED ORDER — SODIUM CHLORIDE 0.9% FLUSH
10.0000 mL | INTRAVENOUS | Status: DC | PRN
  Administered 2018-07-20: 10 mL
  Filled 2018-07-20: qty 10

## 2018-07-20 MED ORDER — DEXAMETHASONE SODIUM PHOSPHATE 10 MG/ML IJ SOLN
INTRAMUSCULAR | Status: AC
  Filled 2018-07-20: qty 1

## 2018-07-20 MED ORDER — SODIUM CHLORIDE 0.9 % IV SOLN
80.0000 mg/m2 | Freq: Once | INTRAVENOUS | Status: AC
  Administered 2018-07-20: 140 mg via INTRAVENOUS
  Filled 2018-07-20: qty 7

## 2018-07-20 MED ORDER — SODIUM CHLORIDE 0.9 % IV SOLN
Freq: Once | INTRAVENOUS | Status: AC
  Administered 2018-07-20: 15:00:00 via INTRAVENOUS
  Filled 2018-07-20: qty 250

## 2018-07-20 NOTE — Telephone Encounter (Signed)
Checked out appt for 5/18 los - did not add additional cycles. Next appt already scheduled .

## 2018-07-21 ENCOUNTER — Inpatient Hospital Stay: Payer: Medicare Other

## 2018-07-21 ENCOUNTER — Other Ambulatory Visit: Payer: Self-pay

## 2018-07-21 VITALS — BP 124/56 | HR 68 | Temp 98.7°F | Resp 17

## 2018-07-21 DIAGNOSIS — C3491 Malignant neoplasm of unspecified part of right bronchus or lung: Secondary | ICD-10-CM

## 2018-07-21 DIAGNOSIS — C342 Malignant neoplasm of middle lobe, bronchus or lung: Secondary | ICD-10-CM | POA: Diagnosis not present

## 2018-07-21 MED ORDER — SODIUM CHLORIDE 0.9 % IV SOLN
80.0000 mg/m2 | Freq: Once | INTRAVENOUS | Status: AC
  Administered 2018-07-21: 140 mg via INTRAVENOUS
  Filled 2018-07-21: qty 7

## 2018-07-21 MED ORDER — DEXAMETHASONE SODIUM PHOSPHATE 10 MG/ML IJ SOLN
10.0000 mg | Freq: Once | INTRAMUSCULAR | Status: AC
  Administered 2018-07-21: 10 mg via INTRAVENOUS

## 2018-07-21 MED ORDER — SODIUM CHLORIDE 0.9 % IV SOLN
Freq: Once | INTRAVENOUS | Status: AC
  Administered 2018-07-21: 13:00:00 via INTRAVENOUS
  Filled 2018-07-21: qty 250

## 2018-07-21 MED ORDER — HEPARIN SOD (PORK) LOCK FLUSH 100 UNIT/ML IV SOLN
500.0000 [IU] | Freq: Once | INTRAVENOUS | Status: AC | PRN
  Administered 2018-07-21: 15:00:00 500 [IU]
  Filled 2018-07-21: qty 5

## 2018-07-21 MED ORDER — SODIUM CHLORIDE 0.9% FLUSH
10.0000 mL | INTRAVENOUS | Status: DC | PRN
  Administered 2018-07-21: 10 mL
  Filled 2018-07-21: qty 10

## 2018-07-21 MED ORDER — DEXAMETHASONE SODIUM PHOSPHATE 10 MG/ML IJ SOLN
INTRAMUSCULAR | Status: AC
  Filled 2018-07-21: qty 1

## 2018-07-21 NOTE — Patient Instructions (Signed)
[facility] Discharge Instructions for Patients Receiving Chemotherapy  Today you received the following chemotherapy agents: Etoposide  To help prevent nausea and vomiting after your treatment, we encourage you to take your nausea medication as directed.   If you develop nausea and vomiting that is not controlled by your nausea medication, call the clinic.   BELOW ARE SYMPTOMS THAT SHOULD BE REPORTED IMMEDIATELY:  *FEVER GREATER THAN 100.5 F  *CHILLS WITH OR WITHOUT FEVER  NAUSEA AND VOMITING THAT IS NOT CONTROLLED WITH YOUR NAUSEA MEDICATION  *UNUSUAL SHORTNESS OF BREATH  *UNUSUAL BRUISING OR BLEEDING  TENDERNESS IN MOUTH AND THROAT WITH OR WITHOUT PRESENCE OF ULCERS  *URINARY PROBLEMS  *BOWEL PROBLEMS  UNUSUAL RASH Items with * indicate a potential emergency and should be followed up as soon as possible.  Feel free to call the clinic should you have any questions or concerns. The clinic phone number is [phone].  Please show the [facility] at check-in to the Emergency Department and triage nurse.

## 2018-07-23 ENCOUNTER — Inpatient Hospital Stay: Payer: Medicare Other

## 2018-07-23 ENCOUNTER — Other Ambulatory Visit: Payer: Self-pay

## 2018-07-23 VITALS — BP 131/66 | HR 78 | Temp 99.1°F | Resp 16

## 2018-07-23 DIAGNOSIS — C3491 Malignant neoplasm of unspecified part of right bronchus or lung: Secondary | ICD-10-CM

## 2018-07-23 DIAGNOSIS — C342 Malignant neoplasm of middle lobe, bronchus or lung: Secondary | ICD-10-CM | POA: Diagnosis not present

## 2018-07-23 MED ORDER — PEGFILGRASTIM INJECTION 6 MG/0.6ML ~~LOC~~
PREFILLED_SYRINGE | SUBCUTANEOUS | Status: AC
  Filled 2018-07-23: qty 0.6

## 2018-07-23 MED ORDER — PEGFILGRASTIM INJECTION 6 MG/0.6ML ~~LOC~~
6.0000 mg | PREFILLED_SYRINGE | Freq: Once | SUBCUTANEOUS | Status: AC
  Administered 2018-07-23: 6 mg via SUBCUTANEOUS

## 2018-07-27 ENCOUNTER — Other Ambulatory Visit: Payer: Self-pay

## 2018-07-27 ENCOUNTER — Inpatient Hospital Stay: Payer: Medicare Other

## 2018-07-27 DIAGNOSIS — C342 Malignant neoplasm of middle lobe, bronchus or lung: Secondary | ICD-10-CM | POA: Diagnosis not present

## 2018-07-27 DIAGNOSIS — C3491 Malignant neoplasm of unspecified part of right bronchus or lung: Secondary | ICD-10-CM

## 2018-07-27 LAB — CMP (CANCER CENTER ONLY)
ALT: 11 U/L (ref 0–44)
AST: 13 U/L — ABNORMAL LOW (ref 15–41)
Albumin: 3.3 g/dL — ABNORMAL LOW (ref 3.5–5.0)
Alkaline Phosphatase: 110 U/L (ref 38–126)
Anion gap: 8 (ref 5–15)
BUN: 15 mg/dL (ref 8–23)
CO2: 27 mmol/L (ref 22–32)
Calcium: 8.2 mg/dL — ABNORMAL LOW (ref 8.9–10.3)
Chloride: 105 mmol/L (ref 98–111)
Creatinine: 0.81 mg/dL (ref 0.44–1.00)
GFR, Est AFR Am: >60 mL/min (ref >60)
GFR, Estimated: >60 mL/min (ref >60)
Glucose, Bld: 105 mg/dL — ABNORMAL HIGH (ref 70–99)
Potassium: 4.5 mmol/L (ref 3.5–5.1)
Sodium: 140 mmol/L (ref 135–145)
Total Bilirubin: 0.2 mg/dL — ABNORMAL LOW (ref 0.3–1.2)
Total Protein: 6 g/dL — ABNORMAL LOW (ref 6.5–8.1)

## 2018-07-27 LAB — CBC WITH DIFFERENTIAL (CANCER CENTER ONLY)
Abs Immature Granulocytes: 0.54 10*3/uL — ABNORMAL HIGH (ref 0.00–0.07)
Basophils Absolute: 0.1 10*3/uL (ref 0.0–0.1)
Basophils Relative: 1 %
Eosinophils Absolute: 0 10*3/uL (ref 0.0–0.5)
Eosinophils Relative: 0 %
HCT: 26.2 % — ABNORMAL LOW (ref 36.0–46.0)
Hemoglobin: 8 g/dL — ABNORMAL LOW (ref 12.0–15.0)
Immature Granulocytes: 4 %
Lymphocytes Relative: 4 %
Lymphs Abs: 0.6 10*3/uL — ABNORMAL LOW (ref 0.7–4.0)
MCH: 32.8 pg (ref 26.0–34.0)
MCHC: 30.5 g/dL (ref 30.0–36.0)
MCV: 107.4 fL — ABNORMAL HIGH (ref 80.0–100.0)
Monocytes Absolute: 0.9 10*3/uL (ref 0.1–1.0)
Monocytes Relative: 6 %
Neutro Abs: 13.1 10*3/uL — ABNORMAL HIGH (ref 1.7–7.7)
Neutrophils Relative %: 85 %
Platelet Count: 256 10*3/uL (ref 150–400)
RBC: 2.44 MIL/uL — ABNORMAL LOW (ref 3.87–5.11)
RDW: 16.4 % — ABNORMAL HIGH (ref 11.5–15.5)
WBC Count: 15.3 10*3/uL — ABNORMAL HIGH (ref 4.0–10.5)
nRBC: 0 % (ref 0.0–0.2)

## 2018-08-02 ENCOUNTER — Other Ambulatory Visit: Payer: Self-pay

## 2018-08-02 ENCOUNTER — Inpatient Hospital Stay: Payer: Medicare Other | Attending: Internal Medicine

## 2018-08-02 DIAGNOSIS — R0602 Shortness of breath: Secondary | ICD-10-CM | POA: Diagnosis not present

## 2018-08-02 DIAGNOSIS — C342 Malignant neoplasm of middle lobe, bronchus or lung: Secondary | ICD-10-CM | POA: Diagnosis present

## 2018-08-02 DIAGNOSIS — C787 Secondary malignant neoplasm of liver and intrahepatic bile duct: Secondary | ICD-10-CM | POA: Insufficient documentation

## 2018-08-02 DIAGNOSIS — Z79899 Other long term (current) drug therapy: Secondary | ICD-10-CM | POA: Insufficient documentation

## 2018-08-02 DIAGNOSIS — J449 Chronic obstructive pulmonary disease, unspecified: Secondary | ICD-10-CM | POA: Insufficient documentation

## 2018-08-02 DIAGNOSIS — R5383 Other fatigue: Secondary | ICD-10-CM | POA: Diagnosis not present

## 2018-08-02 DIAGNOSIS — M8580 Other specified disorders of bone density and structure, unspecified site: Secondary | ICD-10-CM | POA: Insufficient documentation

## 2018-08-02 DIAGNOSIS — R531 Weakness: Secondary | ICD-10-CM | POA: Diagnosis not present

## 2018-08-02 DIAGNOSIS — Z5112 Encounter for antineoplastic immunotherapy: Secondary | ICD-10-CM | POA: Insufficient documentation

## 2018-08-02 DIAGNOSIS — Z7951 Long term (current) use of inhaled steroids: Secondary | ICD-10-CM | POA: Insufficient documentation

## 2018-08-02 DIAGNOSIS — Z9981 Dependence on supplemental oxygen: Secondary | ICD-10-CM | POA: Diagnosis not present

## 2018-08-02 DIAGNOSIS — C3491 Malignant neoplasm of unspecified part of right bronchus or lung: Secondary | ICD-10-CM

## 2018-08-02 DIAGNOSIS — K219 Gastro-esophageal reflux disease without esophagitis: Secondary | ICD-10-CM | POA: Diagnosis not present

## 2018-08-02 DIAGNOSIS — C7951 Secondary malignant neoplasm of bone: Secondary | ICD-10-CM | POA: Diagnosis not present

## 2018-08-02 LAB — TSH: TSH: 0.456 u[IU]/mL (ref 0.308–3.960)

## 2018-08-05 ENCOUNTER — Other Ambulatory Visit: Payer: Self-pay

## 2018-08-05 ENCOUNTER — Ambulatory Visit (HOSPITAL_COMMUNITY)
Admission: RE | Admit: 2018-08-05 | Discharge: 2018-08-05 | Disposition: A | Discharge status: Home / Self Care | Payer: Medicare Other | Source: Ambulatory Visit | Attending: Physician Assistant | Admitting: Physician Assistant

## 2018-08-05 DIAGNOSIS — C3491 Malignant neoplasm of unspecified part of right bronchus or lung: Secondary | ICD-10-CM

## 2018-08-06 ENCOUNTER — Other Ambulatory Visit: Payer: Self-pay | Admitting: *Deleted

## 2018-08-06 DIAGNOSIS — C3491 Malignant neoplasm of unspecified part of right bronchus or lung: Secondary | ICD-10-CM

## 2018-08-09 ENCOUNTER — Inpatient Hospital Stay (HOSPITAL_BASED_OUTPATIENT_CLINIC_OR_DEPARTMENT_OTHER): Payer: Medicare Other | Admitting: Internal Medicine

## 2018-08-09 ENCOUNTER — Other Ambulatory Visit: Payer: Self-pay

## 2018-08-09 ENCOUNTER — Inpatient Hospital Stay: Payer: Medicare Other

## 2018-08-09 ENCOUNTER — Encounter: Payer: Self-pay | Admitting: Internal Medicine

## 2018-08-09 VITALS — BP 129/63 | HR 93 | Temp 98.9°F | Resp 18 | Ht 64.0 in | Wt 136.8 lb

## 2018-08-09 DIAGNOSIS — I10 Essential (primary) hypertension: Secondary | ICD-10-CM

## 2018-08-09 DIAGNOSIS — Z7189 Other specified counseling: Secondary | ICD-10-CM

## 2018-08-09 DIAGNOSIS — M8580 Other specified disorders of bone density and structure, unspecified site: Secondary | ICD-10-CM | POA: Diagnosis not present

## 2018-08-09 DIAGNOSIS — C342 Malignant neoplasm of middle lobe, bronchus or lung: Secondary | ICD-10-CM

## 2018-08-09 DIAGNOSIS — R531 Weakness: Secondary | ICD-10-CM

## 2018-08-09 DIAGNOSIS — Z7951 Long term (current) use of inhaled steroids: Secondary | ICD-10-CM

## 2018-08-09 DIAGNOSIS — C3491 Malignant neoplasm of unspecified part of right bronchus or lung: Secondary | ICD-10-CM

## 2018-08-09 DIAGNOSIS — K219 Gastro-esophageal reflux disease without esophagitis: Secondary | ICD-10-CM

## 2018-08-09 DIAGNOSIS — C787 Secondary malignant neoplasm of liver and intrahepatic bile duct: Secondary | ICD-10-CM

## 2018-08-09 DIAGNOSIS — Z79899 Other long term (current) drug therapy: Secondary | ICD-10-CM

## 2018-08-09 DIAGNOSIS — Z5112 Encounter for antineoplastic immunotherapy: Secondary | ICD-10-CM

## 2018-08-09 DIAGNOSIS — R0602 Shortness of breath: Secondary | ICD-10-CM

## 2018-08-09 DIAGNOSIS — R5383 Other fatigue: Secondary | ICD-10-CM

## 2018-08-09 DIAGNOSIS — C7951 Secondary malignant neoplasm of bone: Secondary | ICD-10-CM | POA: Diagnosis not present

## 2018-08-09 DIAGNOSIS — J449 Chronic obstructive pulmonary disease, unspecified: Secondary | ICD-10-CM

## 2018-08-09 LAB — CBC WITH DIFFERENTIAL (CANCER CENTER ONLY)
Abs Immature Granulocytes: 0.77 10*3/uL — ABNORMAL HIGH (ref 0.00–0.07)
Basophils Absolute: 0.1 10*3/uL (ref 0.0–0.1)
Basophils Relative: 1 %
Eosinophils Absolute: 0.1 10*3/uL (ref 0.0–0.5)
Eosinophils Relative: 0 %
HCT: 34 % — ABNORMAL LOW (ref 36.0–46.0)
Hemoglobin: 10.3 g/dL — ABNORMAL LOW (ref 12.0–15.0)
Immature Granulocytes: 4 %
Lymphocytes Relative: 6 %
Lymphs Abs: 1.2 10*3/uL (ref 0.7–4.0)
MCH: 33.4 pg (ref 26.0–34.0)
MCHC: 30.3 g/dL (ref 30.0–36.0)
MCV: 110.4 fL — ABNORMAL HIGH (ref 80.0–100.0)
Monocytes Absolute: 0.8 10*3/uL (ref 0.1–1.0)
Monocytes Relative: 4 %
Neutro Abs: 18.4 10*3/uL — ABNORMAL HIGH (ref 1.7–7.7)
Neutrophils Relative %: 85 %
Platelet Count: 384 10*3/uL (ref 150–400)
RBC: 3.08 MIL/uL — ABNORMAL LOW (ref 3.87–5.11)
RDW: 17 % — ABNORMAL HIGH (ref 11.5–15.5)
WBC Count: 21.4 10*3/uL — ABNORMAL HIGH (ref 4.0–10.5)
nRBC: 0 % (ref 0.0–0.2)

## 2018-08-09 LAB — CMP (CANCER CENTER ONLY)
ALT: 9 U/L (ref 0–44)
AST: 17 U/L (ref 15–41)
Albumin: 3.4 g/dL — ABNORMAL LOW (ref 3.5–5.0)
Alkaline Phosphatase: 93 U/L (ref 38–126)
Anion gap: 10 (ref 5–15)
BUN: 14 mg/dL (ref 8–23)
CO2: 24 mmol/L (ref 22–32)
Calcium: 8.4 mg/dL — ABNORMAL LOW (ref 8.9–10.3)
Chloride: 107 mmol/L (ref 98–111)
Creatinine: 1.33 mg/dL — ABNORMAL HIGH (ref 0.44–1.00)
GFR, Est AFR Am: 44 mL/min — ABNORMAL LOW (ref >60)
GFR, Estimated: 38 mL/min — ABNORMAL LOW (ref >60)
Glucose, Bld: 97 mg/dL (ref 70–99)
Potassium: 4.5 mmol/L (ref 3.5–5.1)
Sodium: 141 mmol/L (ref 135–145)
Total Bilirubin: 0.2 mg/dL — ABNORMAL LOW (ref 0.3–1.2)
Total Protein: 6.2 g/dL — ABNORMAL LOW (ref 6.5–8.1)

## 2018-08-09 MED ORDER — SODIUM CHLORIDE 0.9 % IV SOLN
Freq: Once | INTRAVENOUS | Status: AC
  Administered 2018-08-09: 13:00:00 via INTRAVENOUS
  Filled 2018-08-09: qty 250

## 2018-08-09 MED ORDER — HEPARIN SOD (PORK) LOCK FLUSH 100 UNIT/ML IV SOLN
500.0000 [IU] | Freq: Once | INTRAVENOUS | Status: AC | PRN
  Administered 2018-08-09: 500 [IU]
  Filled 2018-08-09: qty 5

## 2018-08-09 MED ORDER — SODIUM CHLORIDE 0.9% FLUSH
10.0000 mL | INTRAVENOUS | Status: DC | PRN
  Administered 2018-08-09: 10 mL
  Filled 2018-08-09: qty 10

## 2018-08-09 MED ORDER — SODIUM CHLORIDE 0.9 % IV SOLN
1200.0000 mg | Freq: Once | INTRAVENOUS | Status: AC
  Administered 2018-08-09: 14:00:00 1200 mg via INTRAVENOUS
  Filled 2018-08-09: qty 20

## 2018-08-09 NOTE — Patient Instructions (Signed)
Atezolizumab injection What is this medicine? ATEZOLIZUMAB (a te zoe LIZ ue mab) is a monoclonal antibody. It is used to treat bladder cancer (urothelial cancer), non-small cell lung cancer, small cell lung cancer, and breast cancer. This medicine may be used for other purposes; ask your health care provider or pharmacist if you have questions. COMMON BRAND NAME(S): Tecentriq What should I tell my health care provider before I take this medicine? They need to know if you have any of these conditions: -diabetes -immune system problems -infection -inflammatory bowel disease -liver disease -lung or breathing disease -lupus -nervous system problems like myasthenia gravis or Guillain-Barre syndrome -organ transplant -an unusual or allergic reaction to atezolizumab, other medicines, foods, dyes, or preservatives -pregnant or trying to get pregnant -breast-feeding How should I use this medicine? This medicine is for infusion into a vein. It is given by a health care professional in a hospital or clinic setting. A special MedGuide will be given to you before each treatment. Be sure to read this information carefully each time. Talk to your pediatrician regarding the use of this medicine in children. Special care may be needed. Overdosage: If you think you have taken too much of this medicine contact a poison control center or emergency room at once. NOTE: This medicine is only for you. Do not share this medicine with others. What if I miss a dose? It is important not to miss your dose. Call your doctor or health care professional if you are unable to keep an appointment. What may interact with this medicine? Interactions have not been studied. This list may not describe all possible interactions. Give your health care provider a list of all the medicines, herbs, non-prescription drugs, or dietary supplements you use. Also tell them if you smoke, drink alcohol, or use illegal drugs. Some items  may interact with your medicine. What should I watch for while using this medicine? Your condition will be monitored carefully while you are receiving this medicine. You may need blood work done while you are taking this medicine. Do not become pregnant while taking this medicine or for at least 5 months after stopping it. Women should inform their doctor if they wish to become pregnant or think they might be pregnant. There is a potential for serious side effects to an unborn child. Talk to your health care professional or pharmacist for more information. Do not breast-feed an infant while taking this medicine or for at least 5 months after the last dose. What side effects may I notice from receiving this medicine? Side effects that you should report to your doctor or health care professional as soon as possible: -allergic reactions like skin rash, itching or hives, swelling of the face, lips, or tongue -black, tarry stools -bloody or watery diarrhea -breathing problems -changes in vision -chest pain or chest tightness -chills -facial flushing -fever -headache -signs and symptoms of high blood sugar such as dizziness; dry mouth; dry skin; fruity breath; nausea; stomach pain; increased hunger or thirst; increased urination -signs and symptoms of liver injury like dark yellow or brown urine; general ill feeling or flu-like symptoms; light-colored stools; loss of appetite; nausea; right upper belly pain; unusually weak or tired; yellowing of the eyes or skin -stomach pain -trouble passing urine or change in the amount of urine Side effects that usually do not require medical attention (report to your doctor or health care professional if they continue or are bothersome): -cough -diarrhea -joint pain -muscle pain -muscle weakness -tiredness -weight loss   This list may not describe all possible side effects. Call your doctor for medical advice about side effects. You may report side effects  to FDA at 1-800-FDA-1088. Where should I keep my medicine? This drug is given in a hospital or clinic and will not be stored at home. NOTE: This sheet is a summary. It may not cover all possible information. If you have questions about this medicine, talk to your doctor, pharmacist, or health care provider.  2019 Elsevier/Gold Standard (2017-05-22 09:33:38)  

## 2018-08-09 NOTE — Progress Notes (Signed)
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   PRIOR THERAPY: None   CURRENT THERAPY: Dose reduced carboplatin for AUC of 4 on day 1, etoposide 80 mg/M2 on days 1, 2 and 3 with Tecentriq 1200 mg IV every 3 weeks with Neulasta support. First dose 05/17/2018. Status post 4 cycles.  Starting from cycle #5 the patient will be treated with maintenance Tecentriq every 3 weeks.  INTERVAL HISTORY: [name] 78 y.o. female returns to the clinic today for follow-up visit.  The patient is feeling fine today with no concerning complaints except for fatigue.  She has been tolerating her systemic chemotherapy fairly well.  She denied having any nausea, vomiting, diarrhea or constipation.  She denied having any fever or chills.  She has no chest pain but continues to have shortness breath with exertion with no cough or hemoptysis.  She has been tolerating her systemic chemotherapy fairly well.  The patient had repeat CT scan of the chest, abdomen and pelvis performed recently and she is here for evaluation and discussion of her scan results.  MEDICAL HISTORY: Past Medical History:  Diagnosis Date   COPD (chronic obstructive pulmonary disease) (HCC)    Dyspnea    GERD (gastroesophageal reflux disease)    On home oxygen therapy    Tachycardia     ALLERGIES:  is allergic to morphine; oxycodone-acetaminophen; celecoxib; ciprofloxacin; codeine; iodine; levaquin  [levofloxacin in d5w]; lisinopril; losartan; midazolam; nsaids; penicillins; statins; atorvastatin; citalopram; colesevelam; fenofibrate; tramadol hcl; aleve [naproxen sodium];  azithromycin; cyclobenzaprine; erythromycin; fosamax [alendronate]; meloxicam; moxifloxacin; and sulfonamide derivatives.  MEDICATIONS:  Current Outpatient Medications  Medication Sig Dispense Refill   acetaminophen (TYLENOL) 325 MG tablet Take by mouth. arthritis     albuterol (PROVENTIL HFA;VENTOLIN HFA) 108 (90 Base) MCG/ACT inhaler Inhale into the lungs every 6 (six) hours as needed for wheezing or shortness of breath.     Azelastine HCl 0.15 % SOLN 1-2 sprays each nostril 2 times per day as needed. 30 mL 5   budesonide-formoterol (SYMBICORT) 160-4.5 MCG/ACT inhaler Inhale 2 puffs into the lungs 2 (two) times daily.     carvedilol (COREG) 6.25 MG tablet Take 1 tablet (6.25 mg total) by mouth 2 (two) times daily with a meal. 180 tablet 3   diphenhydrAMINE (BENADRYL) 25 MG tablet Take by mouth. As needed for allergies     gabapentin (NEURONTIN) 300 MG capsule Take 300 mg by mouth 2 (two) times daily.   2   ipratropium (ATROVENT) 0.02 % nebulizer solution Take 0.5 mg by nebulization 4 (four) times daily as needed for wheezing or shortness of breath.     lidocaine-prilocaine (EMLA) cream Apply 1 application topically as needed. Apply on skin over port site 1-2 hours prior to treatment.  Cove with plastic wrap. (Patient not taking: Reported on 05/21/2018) 30 g 0   losartan (COZAAR) 25 MG tablet Take 1 tablet (25 mg total) by mouth daily. 90 tablet 3   omeprazole (PRILOSEC) 20 MG capsule Take by mouth.     OXYGEN Inhale 2-3 L into the lungs.     pravastatin (PRAVACHOL) 80 MG tablet TAKE ONE TABLET BY MOUTH ONCE DAILY  IN THE EVENING     predniSONE (DELTASONE) 10 MG tablet Take 10 mg by mouth daily.     predniSONE (DELTASONE) 5 MG tablet 6 tab x 1 day, 5 tab x 1 day, 4 tab x 1 day, 3 tab x 1 day, 2 tab x 1 day, 1 tab x 1 day, stop 21 tablet 0   prochlorperazine (COMPAZINE) 10 MG tablet Take 1 tablet (10 mg total) by mouth every 6 (six) hours as needed for nausea or vomiting. 30 tablet  0   torsemide (DEMADEX) 20 MG tablet TAKE 20 MG DAILY MAY TAKE ADDITIONAL 20 MG AS NEEDED FOR SWELLING 180 tablet 1   triamcinolone (NASACORT) 55 MCG/ACT AERO nasal inhaler Place into the nose.     No current facility-administered medications for this visit.     SURGICAL HISTORY:  Past Surgical History:  Procedure Laterality Date   APPENDECTOMY     arthroscopc     total knee   BACK SURGERY     FOOT SURGERY     IR IMAGING GUIDED PORT INSERTION  05/25/2018   knee surgery bilateral     PARTIAL HYSTERECTOMY     ruptured disk      REVIEW OF SYSTEMS:  Constitutional: positive for fatigue Eyes: negative Ears, nose, mouth, throat, and face: negative Respiratory: positive for dyspnea on exertion Cardiovascular: negative Gastrointestinal: negative Genitourinary:negative Integument/breast: negative Hematologic/lymphatic: negative Musculoskeletal:positive for muscle weakness Neurological: negative Behavioral/Psych: negative Endocrine: negative Allergic/Immunologic: negative   PHYSICAL EXAMINATION: General appearance: alert, cooperative, fatigued and no distress Head: Normocephalic, without obvious abnormality, atraumatic Neck: no adenopathy, no JVD, supple, symmetrical, trachea midline and thyroid not enlarged, symmetric, no tenderness/mass/nodules Lymph nodes: Cervical, supraclavicular, and axillary nodes normal. Resp: clear to auscultation bilaterally Back: symmetric, no curvature. ROM normal. No CVA tenderness. Cardio: regular rate and rhythm, S1, S2 normal, no murmur, click, rub or gallop GI: soft, non-tender; bowel sounds normal; no masses,  no organomegaly Extremities: extremities normal, atraumatic, no cyanosis or edema Neurologic: Alert and oriented X 3, normal strength and tone. Normal symmetric reflexes. Normal coordination and gait  ECOG PERFORMANCE STATUS: 1 - Symptomatic but completely ambulatory  Blood pressure 129/63, pulse 93, temperature 98.9 F (37.2  C), temperature source Oral, resp. rate 18, height 5\' 4"  (1.626 m), weight 136 lb 12.8 oz (62.1 kg), SpO2 94 %.  LABORATORY DATA: Lab Results  Component Value Date   WBC 21.4 (H) 08/09/2018   HGB 10.3 (L) 08/09/2018   HCT 34.0 (L) 08/09/2018   MCV 110.4 (H) 08/09/2018   PLT 384 08/09/2018      Chemistry      Component Value Date/Time   NA 141 08/09/2018 1110   K 4.5 08/09/2018 1110   CL 107 08/09/2018 1110   CO2 24 08/09/2018 1110   BUN 14 08/09/2018 1110   CREATININE 1.33 (H) 08/09/2018 1110      Component Value Date/Time   CALCIUM 8.4 (L) 08/09/2018 1110   ALKPHOS 93 08/09/2018 1110   AST 17 08/09/2018 1110   ALT 9 08/09/2018 1110   BILITOT 0.2 (L) 08/09/2018 1110       RADIOGRAPHIC STUDIES: Ct Abdomen Pelvis Wo Contrast  Result Date: 08/05/2018 CLINICAL DATA:  Follow-up right lung cancer, chemotherapy EXAM: CT CHEST, ABDOMEN AND PELVIS WITHOUT CONTRAST TECHNIQUE: Multidetector CT imaging of the chest, abdomen and pelvis was performed following the standard protocol without IV contrast. Oral enteric contrast was administered. COMPARISON:  CT chest abdomen pelvis, 06/25/2018, PET-CT, 04/22/2018 FINDINGS: CT CHEST FINDINGS  Cardiovascular: Right chest port catheter. Cardiomegaly. Three-vessel coronary artery calcifications. No pericardial effusion. 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 Mediastinum/Nodes: No enlarged mediastinal, hilar, or axillary lymph nodes. Enlarged left lobe of the thyroid without discrete nodule. Trachea and esophagus demonstrate no significant findings. Lungs/Pleura: Redemonstrated total consolidation and bronchiectasis of the right middle lobe and left lower lobe. Unchanged post treatment appearance of the right hilum without evidence of recurrent mass. Unchanged 6 mm pulmonary nodule of the right upper lobe (series 4, image 34). There are numerous  additional scattered small pulmonary nodules present bilaterally, unchanged from prior examination and generally in a peripheral, centrilobular distribution. Moderate centrilobular emphysema. No pleural effusion or pneumothorax. Musculoskeletal: No chest wall mass or suspicious bone lesions identified. CT ABDOMEN PELVIS FINDINGS Hepatobiliary: Discrete liver lesions are not well appreciated on noncontrast CT; compared to prior CT there is decreased conspicuity of lesions which can be seen (e.g. Series 2, image 49) and post treatment capsular retraction at the site of a previously seen hypermetabolic lesion of the anterior left lobe (series 2, image 52). No gallstones, gallbladder wall thickening, or biliary dilatation. Pancreas: Unremarkable. No pancreatic ductal dilatation or surrounding inflammatory changes. Spleen: Normal in size without focal abnormality. Adrenals/Urinary Tract: Adrenal glands are unremarkable. Kidneys are normal, without renal calculi, focal lesion, or hydronephrosis. Bladder is unremarkable. Stomach/Bowel: Stomach is within normal limits. No evidence of bowel wall thickening, distention, or inflammatory changes. Sigmoid diverticulosis. Vascular/Lymphatic: Severe aortic atherosclerosis. No enlarged abdominal or pelvic lymph nodes. Reproductive: No mass or other abnormality. Other: No abdominal wall hernia or abnormality. No abdominopelvic ascites. Musculoskeletal: Osteopenia. Unchanged, sclerotic wedge deformities of the T9 and T12 vertebral bodies. There are no lytic lesions appreciated. IMPRESSION: 1. Redemonstrated total consolidation and bronchiectasis of the right middle lobe and left lower lobe. Unchanged post treatment appearance of the right hilum without evidence of recurrent mass. No hilar or mediastinal lymphadenopathy. 2. Unchanged 6 mm pulmonary nodule of the right upper lobe (series 4, image 34). There are numerous additional scattered small pulmonary nodules present bilaterally,  unchanged from prior examination and generally in a peripheral, centrilobular distribution favoring sequelae of infection or inflammation. Attention on follow-up. 3. Discrete liver lesions are not well appreciated on noncontrast CT; compared to prior CT there is decreased conspicuity of lesions which can be seen (e.g. Series 2, image 49) and post treatment capsular retraction at the site of a previously seen hypermetabolic lesion of the anterior left lobe (series 2, image 52). Findings are consistent with ongoing improvement of hepatic metastatic disease. 4. Osteopenia. Unchanged, sclerotic wedge deformities of the T9 and T12 vertebral bodies. There are no osseous lytic lesions appreciated by CT. 5. Other chronic, incidental, and postoperative findings as detailed above. Electronically Signed   By: Eddie Candle M.D.   On: 08/05/2018 16:42   Ct Chest Wo Contrast  Result Date: 08/05/2018 CLINICAL DATA:  Follow-up right lung cancer, chemotherapy EXAM: CT CHEST, ABDOMEN AND PELVIS WITHOUT CONTRAST TECHNIQUE: Multidetector CT imaging of the chest, abdomen and pelvis was performed following the standard protocol without IV contrast. Oral enteric contrast was administered. COMPARISON:  CT chest abdomen pelvis, 06/25/2018, PET-CT, 04/22/2018 FINDINGS: CT CHEST FINDINGS Cardiovascular: Right chest port catheter. Cardiomegaly. Three-vessel coronary artery calcifications. No pericardial effusion. 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 Mediastinum/Nodes: No enlarged mediastinal,  hilar, or axillary lymph nodes. Enlarged left lobe of the thyroid without discrete nodule. Trachea and esophagus demonstrate no significant findings. Lungs/Pleura: Redemonstrated total consolidation and bronchiectasis of the right middle lobe and left lower lobe. Unchanged post treatment appearance of the right hilum without evidence of  recurrent mass. Unchanged 6 mm pulmonary nodule of the right upper lobe (series 4, image 34). There are numerous additional scattered small pulmonary nodules present bilaterally, unchanged from prior examination and generally in a peripheral, centrilobular distribution. Moderate centrilobular emphysema. No pleural effusion or pneumothorax. Musculoskeletal: No chest wall mass or suspicious bone lesions identified. CT ABDOMEN PELVIS FINDINGS Hepatobiliary: Discrete liver lesions are not well appreciated on noncontrast CT; compared to prior CT there is decreased conspicuity of lesions which can be seen (e.g. Series 2, image 49) and post treatment capsular retraction at the site of a previously seen hypermetabolic lesion of the anterior left lobe (series 2, image 52). No gallstones, gallbladder wall thickening, or biliary dilatation. Pancreas: Unremarkable. No pancreatic ductal dilatation or surrounding inflammatory changes. Spleen: Normal in size without focal abnormality. Adrenals/Urinary Tract: Adrenal glands are unremarkable. Kidneys are normal, without renal calculi, focal lesion, or hydronephrosis. Bladder is unremarkable. Stomach/Bowel: Stomach is within normal limits. No evidence of bowel wall thickening, distention, or inflammatory changes. Sigmoid diverticulosis. Vascular/Lymphatic: Severe aortic atherosclerosis. No enlarged abdominal or pelvic lymph nodes. Reproductive: No mass or other abnormality. Other: No abdominal wall hernia or abnormality. No abdominopelvic ascites. Musculoskeletal: Osteopenia. Unchanged, sclerotic wedge deformities of the T9 and T12 vertebral bodies. There are no lytic lesions appreciated. IMPRESSION: 1. Redemonstrated total consolidation and bronchiectasis of the right middle lobe and left lower lobe. Unchanged post treatment appearance of the right hilum without evidence of recurrent mass. No hilar or mediastinal lymphadenopathy. 2. Unchanged 6 mm pulmonary nodule of the right upper  lobe (series 4, image 34). There are numerous additional scattered small pulmonary nodules present bilaterally, unchanged from prior examination and generally in a peripheral, centrilobular distribution favoring sequelae of infection or inflammation. Attention on follow-up. 3. Discrete liver lesions are not well appreciated on noncontrast CT; compared to prior CT there is decreased conspicuity of lesions which can be seen (e.g. Series 2, image 49) and post treatment capsular retraction at the site of a previously seen hypermetabolic lesion of the anterior left lobe (series 2, image 52). Findings are consistent with ongoing improvement of hepatic metastatic disease. 4. Osteopenia. Unchanged, sclerotic wedge deformities of the T9 and T12 vertebral bodies. There are no osseous lytic lesions appreciated by CT. 5. Other chronic, incidental, and postoperative findings as detailed above. Electronically Signed   By: Eddie Candle M.D.   On: 08/05/2018 16:42    ASSESSMENT AND PLAN: This is a very pleasant 78 years old white female with extensive stage small cell lung cancer diagnosed in February 2020 and currently undergoing systemic chemotherapy with carboplatin, etoposide and Tecentriq status post 4 cycles. The patient has been tolerating this treatment well with no concerning adverse effects. She had repeat CT scan of the chest, abdomen pelvis performed recently.  I personally and independently reviewed the scans and discussed the results with the patient today. Her scan showed further improvement of her disease with no concerning findings for progression. I recommended for the patient to proceed with the maintenance phase of her treatment with Tecentriq 1200 mg IV every 3 weeks.  She will start the first cycle of this treatment today. The patient will come back for follow-up visit in 3 weeks for evaluation before  starting the second cycle of her maintenance therapy. She was advised to call immediately if she has  any concerning symptoms in the interval. The patient voices understanding of current disease status and treatment options and is in agreement with the current care plan.  All questions were answered. The patient knows to call the clinic with any problems, questions or concerns. We can certainly see the patient much sooner if necessary.  I spent 15 minutes counseling the patient face to face. The total time spent in the appointment was 25 minutes.  Disclaimer: This note was dictated with voice recognition software. Similar sounding words can inadvertently be transcribed and may not be corrected upon review.

## 2018-08-10 ENCOUNTER — Inpatient Hospital Stay: Payer: Medicare Other

## 2018-08-11 ENCOUNTER — Ambulatory Visit: Payer: Medicare Other

## 2018-08-13 ENCOUNTER — Ambulatory Visit: Payer: Medicare Other

## 2018-08-24 ENCOUNTER — Telehealth: Payer: Self-pay | Admitting: Internal Medicine

## 2018-08-24 NOTE — Telephone Encounter (Signed)
Scheduled appt per 6/08 los - pt is aware of upcoming appts for 6/29

## 2018-08-27 ENCOUNTER — Other Ambulatory Visit: Payer: Self-pay | Admitting: Medical Oncology

## 2018-08-27 DIAGNOSIS — C3491 Malignant neoplasm of unspecified part of right bronchus or lung: Secondary | ICD-10-CM

## 2018-08-30 ENCOUNTER — Inpatient Hospital Stay: Payer: Medicare Other

## 2018-08-30 ENCOUNTER — Other Ambulatory Visit: Payer: Self-pay

## 2018-08-30 ENCOUNTER — Inpatient Hospital Stay (HOSPITAL_BASED_OUTPATIENT_CLINIC_OR_DEPARTMENT_OTHER): Payer: Medicare Other | Admitting: Internal Medicine

## 2018-08-30 ENCOUNTER — Encounter: Payer: Self-pay | Admitting: Internal Medicine

## 2018-08-30 VITALS — BP 124/74 | HR 78 | Temp 99.1°F | Resp 18 | Ht 64.0 in | Wt 139.2 lb

## 2018-08-30 DIAGNOSIS — C7951 Secondary malignant neoplasm of bone: Secondary | ICD-10-CM

## 2018-08-30 DIAGNOSIS — Z79899 Other long term (current) drug therapy: Secondary | ICD-10-CM

## 2018-08-30 DIAGNOSIS — Z9981 Dependence on supplemental oxygen: Secondary | ICD-10-CM

## 2018-08-30 DIAGNOSIS — M8580 Other specified disorders of bone density and structure, unspecified site: Secondary | ICD-10-CM

## 2018-08-30 DIAGNOSIS — R0602 Shortness of breath: Secondary | ICD-10-CM

## 2018-08-30 DIAGNOSIS — Z5112 Encounter for antineoplastic immunotherapy: Secondary | ICD-10-CM

## 2018-08-30 DIAGNOSIS — C3491 Malignant neoplasm of unspecified part of right bronchus or lung: Secondary | ICD-10-CM

## 2018-08-30 DIAGNOSIS — R5383 Other fatigue: Secondary | ICD-10-CM

## 2018-08-30 DIAGNOSIS — R531 Weakness: Secondary | ICD-10-CM

## 2018-08-30 DIAGNOSIS — K219 Gastro-esophageal reflux disease without esophagitis: Secondary | ICD-10-CM

## 2018-08-30 DIAGNOSIS — C787 Secondary malignant neoplasm of liver and intrahepatic bile duct: Secondary | ICD-10-CM | POA: Diagnosis not present

## 2018-08-30 DIAGNOSIS — C342 Malignant neoplasm of middle lobe, bronchus or lung: Secondary | ICD-10-CM

## 2018-08-30 DIAGNOSIS — J449 Chronic obstructive pulmonary disease, unspecified: Secondary | ICD-10-CM

## 2018-08-30 DIAGNOSIS — Z7951 Long term (current) use of inhaled steroids: Secondary | ICD-10-CM

## 2018-08-30 DIAGNOSIS — I10 Essential (primary) hypertension: Secondary | ICD-10-CM

## 2018-08-30 LAB — CBC WITH DIFFERENTIAL (CANCER CENTER ONLY)
Abs Immature Granulocytes: 0.07 10*3/uL (ref 0.00–0.07)
Basophils Absolute: 0.1 10*3/uL (ref 0.0–0.1)
Basophils Relative: 0 %
Eosinophils Absolute: 0.3 10*3/uL (ref 0.0–0.5)
Eosinophils Relative: 2 %
HCT: 31.4 % — ABNORMAL LOW (ref 36.0–46.0)
Hemoglobin: 9.7 g/dL — ABNORMAL LOW (ref 12.0–15.0)
Immature Granulocytes: 0 %
Lymphocytes Relative: 5 %
Lymphs Abs: 0.9 10*3/uL (ref 0.7–4.0)
MCH: 32.3 pg (ref 26.0–34.0)
MCHC: 30.9 g/dL (ref 30.0–36.0)
MCV: 104.7 fL — ABNORMAL HIGH (ref 80.0–100.0)
Monocytes Absolute: 0.7 10*3/uL (ref 0.1–1.0)
Monocytes Relative: 4 %
Neutro Abs: 14.5 10*3/uL — ABNORMAL HIGH (ref 1.7–7.7)
Neutrophils Relative %: 89 %
Platelet Count: 238 10*3/uL (ref 150–400)
RBC: 3 MIL/uL — ABNORMAL LOW (ref 3.87–5.11)
RDW: 13.2 % (ref 11.5–15.5)
WBC Count: 16.5 10*3/uL — ABNORMAL HIGH (ref 4.0–10.5)
nRBC: 0 % (ref 0.0–0.2)

## 2018-08-30 LAB — CMP (CANCER CENTER ONLY)
ALT: 10 U/L (ref 0–44)
AST: 13 U/L — ABNORMAL LOW (ref 15–41)
Albumin: 2.9 g/dL — ABNORMAL LOW (ref 3.5–5.0)
Alkaline Phosphatase: 64 U/L (ref 38–126)
Anion gap: 10 (ref 5–15)
BUN: 17 mg/dL (ref 8–23)
CO2: 23 mmol/L (ref 22–32)
Calcium: 8.1 mg/dL — ABNORMAL LOW (ref 8.9–10.3)
Chloride: 110 mmol/L (ref 98–111)
Creatinine: 1.06 mg/dL — ABNORMAL HIGH (ref 0.44–1.00)
GFR, Est AFR Am: 58 mL/min — ABNORMAL LOW (ref >60)
GFR, Estimated: 50 mL/min — ABNORMAL LOW (ref >60)
Glucose, Bld: 98 mg/dL (ref 70–99)
Potassium: 4.3 mmol/L (ref 3.5–5.1)
Sodium: 143 mmol/L (ref 135–145)
Total Bilirubin: <0.2 mg/dL — ABNORMAL LOW (ref 0.3–1.2)
Total Protein: 5.9 g/dL — ABNORMAL LOW (ref 6.5–8.1)

## 2018-08-30 LAB — TSH: TSH: 0.631 u[IU]/mL (ref 0.308–3.960)

## 2018-08-30 MED ORDER — SODIUM CHLORIDE 0.9 % IV SOLN
Freq: Once | INTRAVENOUS | Status: AC
  Administered 2018-08-30: 12:00:00 via INTRAVENOUS
  Filled 2018-08-30: qty 250

## 2018-08-30 MED ORDER — SODIUM CHLORIDE 0.9% FLUSH
10.0000 mL | INTRAVENOUS | Status: DC | PRN
  Administered 2018-08-30: 10 mL
  Filled 2018-08-30: qty 10

## 2018-08-30 MED ORDER — SODIUM CHLORIDE 0.9 % IV SOLN
1200.0000 mg | Freq: Once | INTRAVENOUS | Status: AC
  Administered 2018-08-30: 1200 mg via INTRAVENOUS
  Filled 2018-08-30: qty 20

## 2018-08-30 MED ORDER — HEPARIN SOD (PORK) LOCK FLUSH 100 UNIT/ML IV SOLN
500.0000 [IU] | Freq: Once | INTRAVENOUS | Status: AC | PRN
  Administered 2018-08-30: 14:00:00 500 [IU]
  Filled 2018-08-30: qty 5

## 2018-08-30 NOTE — Patient Instructions (Addendum)
[facility] Discharge Instructions for Patients Receiving Chemotherapy  Today you received the following chemotherapy agents : Tecentriq.  To help prevent nausea and vomiting after your treatment, we encourage you to take your nausea medication as prescribed.   If you develop nausea and vomiting that is not controlled by your nausea medication, call the clinic.   BELOW ARE SYMPTOMS THAT SHOULD BE REPORTED IMMEDIATELY:  *FEVER GREATER THAN 100.5 F  *CHILLS WITH OR WITHOUT FEVER  NAUSEA AND VOMITING THAT IS NOT CONTROLLED WITH YOUR NAUSEA MEDICATION  *UNUSUAL SHORTNESS OF BREATH  *UNUSUAL BRUISING OR BLEEDING  TENDERNESS IN MOUTH AND THROAT WITH OR WITHOUT PRESENCE OF ULCERS  *URINARY PROBLEMS  *BOWEL PROBLEMS  UNUSUAL RASH Items with * indicate a potential emergency and should be followed up as soon as possible.  Feel free to call the clinic should you have any questions or concerns. The clinic phone number is [phone].  Please show the [facility] at check-in to the Emergency Department and triage nurse.    Coronavirus (COVID-19) Are you at risk?  Are you at risk for the Coronavirus (COVID-19)?  To be considered HIGH RISK for Coronavirus (COVID-19), you have to meet the following criteria:  . Traveled to [place], [place], [place], [place] or [place]; or in the [place] to [place], [place], [place], or [place]; and have fever, cough, and shortness of breath within the last 2 weeks of travel OR . Been in close contact with a person diagnosed with COVID-19 within the last 2 weeks and have fever, cough, and shortness of breath . IF YOU DO NOT MEET THESE CRITERIA, YOU ARE CONSIDERED LOW RISK FOR COVID-19.  What to do if you are HIGH RISK for COVID-19?  Marland Kitchen If you are having a medical emergency, call 911. . Seek medical care right away. Before you go to a doctor's office, urgent care or emergency department, call ahead and tell them about  your recent travel, contact with someone diagnosed with COVID-19, and your symptoms. You should receive instructions from your physician's office regarding next steps of care.  . When you arrive at healthcare provider, tell the healthcare staff immediately you have returned from visiting [place], [place], [place], [place] or [place]; or traveled in the [place] to [place], [place], [place], or [place]; in the last two weeks or you have been in close contact with a person diagnosed with COVID-19 in the last 2 weeks.   . Tell the health care staff about your symptoms: fever, cough and shortness of breath. . After you have been seen by a medical provider, you will be either: o Tested for (COVID-19) and discharged home on quarantine except to seek medical care if symptoms worsen, and asked to  - Stay home and avoid contact with others until you get your results (4-5 days)  - Avoid travel on public transportation if possible (such as bus, train, or airplane) or o Sent to the Emergency Department by EMS for evaluation, COVID-19 testing, and possible admission depending on your condition and test results.  What to do if you are LOW RISK for COVID-19?  Reduce your risk of any infection by using the same precautions used for avoiding the common cold or flu:  Marland Kitchen Wash your hands often with soap and warm water for at least 20 seconds.  If soap and water are not readily available, use an alcohol-based hand sanitizer with at least 60% alcohol.  . If  coughing or sneezing, cover your mouth and nose by coughing or sneezing into the elbow areas of your shirt or coat, into a tissue or into your sleeve (not your hands). . Avoid shaking hands with others and consider head nods or verbal greetings only. . Avoid touching your eyes, nose, or mouth with unwashed hands.  . Avoid close contact with people who are sick. . Avoid places or events with large numbers of people in one location, like concerts or sporting  events. . Carefully consider travel plans you have or are making. . If you are planning any travel outside or inside the [place], visit the CDC's Travelers' Health webpage for the latest health notices. . If you have some symptoms but not all symptoms, continue to monitor at home and seek medical attention if your symptoms worsen. . If you are having a medical emergency, call 911.   [facility] / e-Visit: [email or web address]         [facility]: [facility]: [phone]                   [facility]: [phone]

## 2018-08-30 NOTE — Progress Notes (Signed)
[name] [phone]   [phone]  OFFICE PROGRESS NOTE  Sinda Du, MD [place] [address]  DIAGNOSIS: Extensive stage (T2b, N3, M1c)small cell lung cancer presented with right middle lobe lung mass in addition to right hilar and mediastinal lymphadenopathy as well as metastatic disease to the bone and liver diagnosed in February 2020.   PRIOR THERAPY: None   CURRENT THERAPY: Dose reduced carboplatin for AUC of 4 on day 1, etoposide 80 mg/M2 on days 1, 2 and 3 with Tecentriq 1200 mg IV every 3 weeks with Neulasta support. First dose 05/17/2018. Status post 5 cycles.  Starting from cycle #5 the patient will be treated with maintenance Tecentriq every 3 weeks.  Starting from cycle #5 the patient is currently on treatment with maintenance Tecentriq.  Status post 1 cycle.  INTERVAL HISTORY: [name] 78 y.o. female returns to the clinic today for follow-up visit.  The patient is feeling fine today with no concerning complaints except for mild fatigue and the baseline shortness of breath and she is currently on home oxygen.  She denied having any chest pain, cough or hemoptysis.  She denied having any recent weight loss or night sweats.  She has no nausea, vomiting, diarrhea or constipation.  She is here today for evaluation before starting cycle #2 of her maintenance therapy.  MEDICAL HISTORY: Past Medical History:  Diagnosis Date   COPD (chronic obstructive pulmonary disease) (HCC)    Dyspnea    GERD (gastroesophageal reflux disease)    On home oxygen therapy    Tachycardia     ALLERGIES:  is allergic to morphine; oxycodone-acetaminophen; celecoxib; ciprofloxacin; codeine; iodine; levaquin  [levofloxacin in d5w]; lisinopril; losartan; midazolam; nsaids; penicillins; statins; atorvastatin; citalopram; colesevelam; fenofibrate; tramadol hcl; aleve [naproxen sodium]; azithromycin; cyclobenzaprine;  erythromycin; fosamax [alendronate]; meloxicam; moxifloxacin; and sulfonamide derivatives.  MEDICATIONS:  Current Outpatient Medications  Medication Sig Dispense Refill   acetaminophen (TYLENOL) 325 MG tablet Take by mouth. arthritis     albuterol (PROVENTIL HFA;VENTOLIN HFA) 108 (90 Base) MCG/ACT inhaler Inhale into the lungs every 6 (six) hours as needed for wheezing or shortness of breath.     Azelastine HCl 0.15 % SOLN 1-2 sprays each nostril 2 times per day as needed. 30 mL 5   benzonatate (TESSALON) 100 MG capsule TAKE 1 CAPSULE BY MOUTH THREE TIMES DAILY AS NEEDED FOR COUGH UP TO FOR 10 DAYS     budesonide-formoterol (SYMBICORT) 160-4.5 MCG/ACT inhaler Inhale 2 puffs into the lungs 2 (two) times daily.     carvedilol (COREG) 6.25 MG tablet Take 1 tablet (6.25 mg total) by mouth 2 (two) times daily with a meal. 180 tablet 3   diphenhydrAMINE (BENADRYL) 25 MG tablet Take by mouth. As needed for allergies     gabapentin (NEURONTIN) 300 MG capsule Take 300 mg by mouth 2 (two) times daily.   2   ipratropium (ATROVENT) 0.02 % nebulizer solution Take 0.5 mg by nebulization 4 (four) times daily as needed for wheezing or shortness of breath.     lidocaine-prilocaine (EMLA) cream Apply 1 application topically as needed. Apply on skin over port site 1-2 hours prior to treatment.  [place]ove with plastic wrap. (Patient not taking: Reported on 05/21/2018) 30 g 0   losartan (COZAAR) 25 MG tablet Take 1 tablet (25 mg total) by mouth daily. 90 tablet 3   omeprazole (PRILOSEC) 20 MG capsule Take by mouth.     OXYGEN Inhale 2-3  L into the lungs.     pravastatin (PRAVACHOL) 80 MG tablet TAKE ONE TABLET BY MOUTH ONCE DAILY IN THE EVENING     predniSONE (DELTASONE) 10 MG tablet Take 10 mg by mouth daily.     predniSONE (DELTASONE) 5 MG tablet 6 tab x 1 day, 5 tab x 1 day, 4 tab x 1 day, 3 tab x 1 day, 2 tab x 1 day, 1 tab x 1 day, stop 21 tablet 0   prochlorperazine (COMPAZINE) 10 MG tablet Take 1  tablet (10 mg total) by mouth every 6 (six) hours as needed for nausea or vomiting. 30 tablet 0   torsemide (DEMADEX) 20 MG tablet TAKE 20 MG DAILY MAY TAKE ADDITIONAL 20 MG AS NEEDED FOR SWELLING 180 tablet 1   triamcinolone (NASACORT) 55 MCG/ACT AERO nasal inhaler Place into the nose.     No current facility-administered medications for this visit.     SURGICAL HISTORY:  Past Surgical History:  Procedure Laterality Date   APPENDECTOMY     arthroscopc     total knee   BACK SURGERY     FOOT SURGERY     IR IMAGING GUIDED PORT INSERTION  05/25/2018   knee surgery bilateral     PARTIAL HYSTERECTOMY     ruptured disk      REVIEW OF SYSTEMS:  A comprehensive review of systems was negative except for: Constitutional: positive for fatigue Respiratory: positive for dyspnea on exertion   PHYSICAL EXAMINATION: General appearance: alert, cooperative, fatigued and no distress Head: Normocephalic, without obvious abnormality, atraumatic Neck: no adenopathy, no JVD, supple, symmetrical, trachea midline and thyroid not enlarged, symmetric, no tenderness/mass/nodules Lymph nodes: Cervical, supraclavicular, and axillary nodes normal. Resp: clear to auscultation bilaterally Back: symmetric, no curvature. ROM normal. No CVA tenderness. Cardio: regular rate and rhythm, S1, S2 normal, no murmur, click, rub or gallop GI: soft, non-tender; bowel sounds normal; no masses,  no organomegaly Extremities: extremities normal, atraumatic, no cyanosis or edema  ECOG PERFORMANCE STATUS: 1 - Symptomatic but completely ambulatory  Blood pressure 124/74, pulse 78, temperature 99.1 F (37.3 C), temperature source Oral, resp. rate 18, height 5\' 4"  (1.626 m), weight 139 lb 3.2 oz (63.1 kg), SpO2 95 %.  LABORATORY DATA: Lab Results  Component Value Date   WBC 16.5 (H) 08/30/2018   HGB 9.7 (L) 08/30/2018   HCT 31.4 (L) 08/30/2018   MCV 104.7 (H) 08/30/2018   PLT 238 08/30/2018      Chemistry        Component Value Date/Time   NA 143 08/30/2018 1022   K 4.3 08/30/2018 1022   CL 110 08/30/2018 1022   CO2 23 08/30/2018 1022   BUN 17 08/30/2018 1022   CREATININE 1.06 (H) 08/30/2018 1022      Component Value Date/Time   CALCIUM 8.1 (L) 08/30/2018 1022   ALKPHOS 64 08/30/2018 1022   AST 13 (L) 08/30/2018 1022   ALT 10 08/30/2018 1022   BILITOT <0.2 (L) 08/30/2018 1022       RADIOGRAPHIC STUDIES: Ct Abdomen Pelvis Wo Contrast  Result Date: 08/05/2018 CLINICAL DATA:  Follow-up right lung cancer, chemotherapy EXAM: CT CHEST, ABDOMEN AND PELVIS WITHOUT CONTRAST TECHNIQUE: Multidetector CT imaging of the chest, abdomen and pelvis was performed following the standard protocol without IV contrast. Oral enteric contrast was administered. COMPARISON:  CT chest abdomen pelvis, 06/25/2018, PET-CT, 04/22/2018 FINDINGS: CT CHEST FINDINGS Cardiovascular: Right chest port catheter. Cardiomegaly. Three-vessel coronary artery calcifications. No pericardial effusion. 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 Mediastinum/Nodes: No enlarged mediastinal, hilar, or axillary lymph nodes. Enlarged left lobe of the thyroid without discrete nodule. Trachea and esophagus demonstrate no significant findings. Lungs/Pleura: Redemonstrated total consolidation and bronchiectasis of the right middle lobe and left lower lobe. Unchanged post treatment appearance of the right hilum without evidence of recurrent mass. Unchanged 6 mm pulmonary nodule of the right upper lobe (series 4, image 34). There are numerous additional scattered small pulmonary nodules present bilaterally, unchanged from prior examination and generally in a peripheral, centrilobular distribution. Moderate centrilobular emphysema. No pleural effusion or pneumothorax. Musculoskeletal: No chest wall mass or suspicious bone lesions identified. CT ABDOMEN  PELVIS FINDINGS Hepatobiliary: Discrete liver lesions are not well appreciated on noncontrast CT; compared to prior CT there is decreased conspicuity of lesions which can be seen (e.g. Series 2, image 49) and post treatment capsular retraction at the site of a previously seen hypermetabolic lesion of the anterior left lobe (series 2, image 52). No gallstones, gallbladder wall thickening, or biliary dilatation. Pancreas: Unremarkable. No pancreatic ductal dilatation or surrounding inflammatory changes. Spleen: Normal in size without focal abnormality. Adrenals/Urinary Tract: Adrenal glands are unremarkable. Kidneys are normal, without renal calculi, focal lesion, or hydronephrosis. Bladder is unremarkable. Stomach/Bowel: Stomach is within normal limits. No evidence of bowel wall thickening, distention, or inflammatory changes. Sigmoid diverticulosis. Vascular/Lymphatic: Severe aortic atherosclerosis. No enlarged abdominal or pelvic lymph nodes. Reproductive: No mass or other abnormality. Other: No abdominal wall hernia or abnormality. No abdominopelvic ascites. Musculoskeletal: Osteopenia. Unchanged, sclerotic wedge deformities of the T9 and T12 vertebral bodies. There are no lytic lesions appreciated. IMPRESSION: 1. Redemonstrated total consolidation and bronchiectasis of the right middle lobe and left lower lobe. Unchanged post treatment appearance of the right hilum without evidence of recurrent mass. No hilar or mediastinal lymphadenopathy. 2. Unchanged 6 mm pulmonary nodule of the right upper lobe (series 4, image 34). There are numerous additional scattered small pulmonary nodules present bilaterally, unchanged from prior examination and generally in a peripheral, centrilobular distribution favoring sequelae of infection or inflammation. Attention on follow-up. 3. Discrete liver lesions are not well appreciated on noncontrast CT; compared to prior CT there is decreased conspicuity of lesions which can be seen  (e.g. Series 2, image 49) and post treatment capsular retraction at the site of a previously seen hypermetabolic lesion of the anterior left lobe (series 2, image 52). Findings are consistent with ongoing improvement of hepatic metastatic disease. 4. Osteopenia. Unchanged, sclerotic wedge deformities of the T9 and T12 vertebral bodies. There are no osseous lytic lesions appreciated by CT. 5. Other chronic, incidental, and postoperative findings as detailed above. Electronically Signed   By: Eddie Candle M.D.   On: 08/05/2018 16:42   Ct Chest Wo Contrast  Result Date: 08/05/2018 CLINICAL DATA:  Follow-up right lung cancer, chemotherapy EXAM: CT CHEST, ABDOMEN AND PELVIS WITHOUT CONTRAST TECHNIQUE: Multidetector CT imaging of the chest, abdomen and pelvis was performed following the standard protocol without IV contrast. Oral enteric contrast was administered. COMPARISON:  CT chest abdomen pelvis, 06/25/2018, PET-CT, 04/22/2018 FINDINGS: CT CHEST FINDINGS Cardiovascular: Right chest port catheter. Cardiomegaly. Three-vessel coronary artery calcifications. No pericardial effusion. 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 Mediastinum/Nodes: No enlarged mediastinal, hilar, or axillary lymph nodes. Enlarged left lobe of the thyroid without discrete nodule. Trachea and  esophagus demonstrate no significant findings. Lungs/Pleura: Redemonstrated total consolidation and bronchiectasis of the right middle lobe and left lower lobe. Unchanged post treatment appearance of the right hilum without evidence of recurrent mass. Unchanged 6 mm pulmonary nodule of the right upper lobe (series 4, image 34). There are numerous additional scattered small pulmonary nodules present bilaterally, unchanged from prior examination and generally in a peripheral, centrilobular distribution. Moderate centrilobular emphysema. No pleural  effusion or pneumothorax. Musculoskeletal: No chest wall mass or suspicious bone lesions identified. CT ABDOMEN PELVIS FINDINGS Hepatobiliary: Discrete liver lesions are not well appreciated on noncontrast CT; compared to prior CT there is decreased conspicuity of lesions which can be seen (e.g. Series 2, image 49) and post treatment capsular retraction at the site of a previously seen hypermetabolic lesion of the anterior left lobe (series 2, image 52). No gallstones, gallbladder wall thickening, or biliary dilatation. Pancreas: Unremarkable. No pancreatic ductal dilatation or surrounding inflammatory changes. Spleen: Normal in size without focal abnormality. Adrenals/Urinary Tract: Adrenal glands are unremarkable. Kidneys are normal, without renal calculi, focal lesion, or hydronephrosis. Bladder is unremarkable. Stomach/Bowel: Stomach is within normal limits. No evidence of bowel wall thickening, distention, or inflammatory changes. Sigmoid diverticulosis. Vascular/Lymphatic: Severe aortic atherosclerosis. No enlarged abdominal or pelvic lymph nodes. Reproductive: No mass or other abnormality. Other: No abdominal wall hernia or abnormality. No abdominopelvic ascites. Musculoskeletal: Osteopenia. Unchanged, sclerotic wedge deformities of the T9 and T12 vertebral bodies. There are no lytic lesions appreciated. IMPRESSION: 1. Redemonstrated total consolidation and bronchiectasis of the right middle lobe and left lower lobe. Unchanged post treatment appearance of the right hilum without evidence of recurrent mass. No hilar or mediastinal lymphadenopathy. 2. Unchanged 6 mm pulmonary nodule of the right upper lobe (series 4, image 34). There are numerous additional scattered small pulmonary nodules present bilaterally, unchanged from prior examination and generally in a peripheral, centrilobular distribution favoring sequelae of infection or inflammation. Attention on follow-up. 3. Discrete liver lesions are not well  appreciated on noncontrast CT; compared to prior CT there is decreased conspicuity of lesions which can be seen (e.g. Series 2, image 49) and post treatment capsular retraction at the site of a previously seen hypermetabolic lesion of the anterior left lobe (series 2, image 52). Findings are consistent with ongoing improvement of hepatic metastatic disease. 4. Osteopenia. Unchanged, sclerotic wedge deformities of the T9 and T12 vertebral bodies. There are no osseous lytic lesions appreciated by CT. 5. Other chronic, incidental, and postoperative findings as detailed above. Electronically Signed   By: Eddie Candle M.D.   On: 08/05/2018 16:42    ASSESSMENT AND PLAN: This is a very pleasant 78 years old white female with extensive stage small cell lung cancer diagnosed in February 2020 and currently undergoing systemic chemotherapy with carboplatin, etoposide and Tecentriq status post 5 cycles. The patient is currently on maintenance treatment with single agent Tecentriq starting from cycle #5.  She is tolerating the treatment well with no concerning adverse effects. I recommended for her to proceed with cycle #6 today as planned. She will come back for follow-up visit in 3 weeks for evaluation before starting the next cycle of her treatment. The patient was advised to call immediately if she has any concerning symptoms in the interval. The patient voices understanding of current disease status and treatment options and is in agreement with the current care plan.  All questions were answered. The patient knows to call the clinic with any problems, questions or concerns. We can certainly see the  patient much sooner if necessary.  I spent 10 minutes counseling the patient face to face. The total time spent in the appointment was 15 minutes.  Disclaimer: This note was dictated with voice recognition software. Similar sounding words can inadvertently be transcribed and may not be corrected upon review.

## 2018-09-20 ENCOUNTER — Other Ambulatory Visit: Payer: Self-pay

## 2018-09-20 ENCOUNTER — Inpatient Hospital Stay: Payer: Medicare Other | Attending: Internal Medicine

## 2018-09-20 ENCOUNTER — Encounter: Payer: Self-pay | Admitting: Internal Medicine

## 2018-09-20 ENCOUNTER — Inpatient Hospital Stay: Payer: Medicare Other

## 2018-09-20 ENCOUNTER — Other Ambulatory Visit: Payer: Self-pay | Admitting: Medical Oncology

## 2018-09-20 ENCOUNTER — Inpatient Hospital Stay (HOSPITAL_BASED_OUTPATIENT_CLINIC_OR_DEPARTMENT_OTHER): Payer: Medicare Other | Admitting: Internal Medicine

## 2018-09-20 VITALS — BP 130/71 | HR 78 | Temp 98.0°F | Resp 18 | Ht 64.0 in | Wt 136.6 lb

## 2018-09-20 DIAGNOSIS — E785 Hyperlipidemia, unspecified: Secondary | ICD-10-CM | POA: Diagnosis not present

## 2018-09-20 DIAGNOSIS — C7951 Secondary malignant neoplasm of bone: Secondary | ICD-10-CM | POA: Diagnosis not present

## 2018-09-20 DIAGNOSIS — Z5112 Encounter for antineoplastic immunotherapy: Secondary | ICD-10-CM | POA: Diagnosis not present

## 2018-09-20 DIAGNOSIS — Z79899 Other long term (current) drug therapy: Secondary | ICD-10-CM

## 2018-09-20 DIAGNOSIS — J449 Chronic obstructive pulmonary disease, unspecified: Secondary | ICD-10-CM | POA: Insufficient documentation

## 2018-09-20 DIAGNOSIS — K219 Gastro-esophageal reflux disease without esophagitis: Secondary | ICD-10-CM | POA: Insufficient documentation

## 2018-09-20 DIAGNOSIS — Z7951 Long term (current) use of inhaled steroids: Secondary | ICD-10-CM | POA: Insufficient documentation

## 2018-09-20 DIAGNOSIS — R5383 Other fatigue: Secondary | ICD-10-CM | POA: Diagnosis not present

## 2018-09-20 DIAGNOSIS — C3491 Malignant neoplasm of unspecified part of right bronchus or lung: Secondary | ICD-10-CM

## 2018-09-20 DIAGNOSIS — C342 Malignant neoplasm of middle lobe, bronchus or lung: Secondary | ICD-10-CM

## 2018-09-20 LAB — CBC WITH DIFFERENTIAL (CANCER CENTER ONLY)
Abs Immature Granulocytes: 0.06 10*3/uL (ref 0.00–0.07)
Basophils Absolute: 0.1 10*3/uL (ref 0.0–0.1)
Basophils Relative: 0 %
Eosinophils Absolute: 0.3 10*3/uL (ref 0.0–0.5)
Eosinophils Relative: 2 %
HCT: 34.1 % — ABNORMAL LOW (ref 36.0–46.0)
Hemoglobin: 10.7 g/dL — ABNORMAL LOW (ref 12.0–15.0)
Immature Granulocytes: 0 %
Lymphocytes Relative: 8 %
Lymphs Abs: 1.1 10*3/uL (ref 0.7–4.0)
MCH: 31.4 pg (ref 26.0–34.0)
MCHC: 31.4 g/dL (ref 30.0–36.0)
MCV: 100 fL (ref 80.0–100.0)
Monocytes Absolute: 1 10*3/uL (ref 0.1–1.0)
Monocytes Relative: 7 %
Neutro Abs: 11.3 10*3/uL — ABNORMAL HIGH (ref 1.7–7.7)
Neutrophils Relative %: 83 %
Platelet Count: 298 10*3/uL (ref 150–400)
RBC: 3.41 MIL/uL — ABNORMAL LOW (ref 3.87–5.11)
RDW: 12.9 % (ref 11.5–15.5)
WBC Count: 13.9 10*3/uL — ABNORMAL HIGH (ref 4.0–10.5)
nRBC: 0 % (ref 0.0–0.2)

## 2018-09-20 LAB — CMP (CANCER CENTER ONLY)
ALT: 12 U/L (ref 0–44)
AST: 16 U/L (ref 15–41)
Albumin: 3.2 g/dL — ABNORMAL LOW (ref 3.5–5.0)
Alkaline Phosphatase: 74 U/L (ref 38–126)
Anion gap: 14 (ref 5–15)
BUN: 15 mg/dL (ref 8–23)
CO2: 23 mmol/L (ref 22–32)
Calcium: 8 mg/dL — ABNORMAL LOW (ref 8.9–10.3)
Chloride: 104 mmol/L (ref 98–111)
Creatinine: 1.05 mg/dL — ABNORMAL HIGH (ref 0.44–1.00)
GFR, Est AFR Am: 59 mL/min — ABNORMAL LOW (ref >60)
GFR, Estimated: 51 mL/min — ABNORMAL LOW (ref >60)
Glucose, Bld: 87 mg/dL (ref 70–99)
Potassium: 4.6 mmol/L (ref 3.5–5.1)
Sodium: 141 mmol/L (ref 135–145)
Total Bilirubin: <0.2 mg/dL — ABNORMAL LOW (ref 0.3–1.2)
Total Protein: 5.9 g/dL — ABNORMAL LOW (ref 6.5–8.1)

## 2018-09-20 LAB — TSH: TSH: 0.49 u[IU]/mL (ref 0.308–3.960)

## 2018-09-20 MED ORDER — SODIUM CHLORIDE 0.9 % IV SOLN
1200.0000 mg | Freq: Once | INTRAVENOUS | Status: AC
  Administered 2018-09-20: 1200 mg via INTRAVENOUS
  Filled 2018-09-20: qty 20

## 2018-09-20 MED ORDER — SODIUM CHLORIDE 0.9% FLUSH
10.0000 mL | INTRAVENOUS | Status: DC | PRN
  Administered 2018-09-20: 10 mL
  Filled 2018-09-20: qty 10

## 2018-09-20 MED ORDER — HEPARIN SOD (PORK) LOCK FLUSH 100 UNIT/ML IV SOLN
500.0000 [IU] | Freq: Once | INTRAVENOUS | Status: AC | PRN
  Administered 2018-09-20: 500 [IU]
  Filled 2018-09-20: qty 5

## 2018-09-20 MED ORDER — SODIUM CHLORIDE 0.9 % IV SOLN
Freq: Once | INTRAVENOUS | Status: AC
  Administered 2018-09-20: 12:00:00 via INTRAVENOUS
  Filled 2018-09-20: qty 250

## 2018-09-20 NOTE — Progress Notes (Signed)
[facility] [phone]   [phone]  OFFICE PROGRESS NOTE  Sinda Du, MD [place] [address]  DIAGNOSIS: Extensive stage (T2b, N3, M1c)small cell lung cancer presented with right middle lobe lung mass in addition to right hilar and mediastinal lymphadenopathy as well as metastatic disease to the bone and liver diagnosed in February 2020.   PRIOR THERAPY: None   CURRENT THERAPY: Dose reduced carboplatin for AUC of 4 on day 1, etoposide 80 mg/M2 on days 1, 2 and 3 with Tecentriq 1200 mg IV every 3 weeks with Neulasta support. First dose 05/17/2018. Status post 6 cycles.  Starting from cycle #5 the patient will be treated with maintenance Tecentriq every 3 weeks.  Starting from cycle #5 the patient is currently on treatment with maintenance Tecentriq.  Status post 2 cycles.  INTERVAL HISTORY: [name] 78 y.o. female returns to the clinic today for follow-up visit.  The patient is feeling fine today with no concerning complaints.  She is tolerating her current treatment with maintenance Tecentriq fairly well.  She denied having any chest pain but has shortness of breath with exertion with no cough or hemoptysis.  She denied having any fever or chills.  She has no nausea, vomiting, diarrhea or constipation.  She has no headache or visual changes.  She is here today for evaluation before starting the third cycle of her maintenance therapy.  MEDICAL HISTORY: Past Medical History:  Diagnosis Date   COPD (chronic obstructive pulmonary disease) (HCC)    Dyspnea    GERD (gastroesophageal reflux disease)    On home oxygen therapy    Tachycardia     ALLERGIES:  is allergic to morphine; oxycodone-acetaminophen; celecoxib; ciprofloxacin; codeine; iodine; levaquin  [levofloxacin in d5w]; lisinopril; losartan; midazolam; nsaids; penicillins; statins; atorvastatin; citalopram; colesevelam; fenofibrate; tramadol hcl; aleve  [naproxen sodium]; azithromycin; cyclobenzaprine; erythromycin; fosamax [alendronate]; meloxicam; moxifloxacin; and sulfonamide derivatives.  MEDICATIONS:  Current Outpatient Medications  Medication Sig Dispense Refill   acetaminophen (TYLENOL) 325 MG tablet Take by mouth. arthritis     albuterol (PROVENTIL HFA;VENTOLIN HFA) 108 (90 Base) MCG/ACT inhaler Inhale into the lungs every 6 (six) hours as needed for wheezing or shortness of breath.     Azelastine HCl 0.15 % SOLN 1-2 sprays each nostril 2 times per day as needed. 30 mL 5   benzonatate (TESSALON) 100 MG capsule TAKE 1 CAPSULE BY MOUTH THREE TIMES DAILY AS NEEDED FOR COUGH UP TO FOR 10 DAYS     budesonide-formoterol (SYMBICORT) 160-4.5 MCG/ACT inhaler Inhale 2 puffs into the lungs 2 (two) times daily.     carvedilol (COREG) 6.25 MG tablet Take 1 tablet (6.25 mg total) by mouth 2 (two) times daily with a meal. 180 tablet 3   diphenhydrAMINE (BENADRYL) 25 MG tablet Take by mouth. As needed for allergies     gabapentin (NEURONTIN) 300 MG capsule Take 300 mg by mouth 2 (two) times daily.   2   ipratropium (ATROVENT) 0.02 % nebulizer solution Take 0.5 mg by nebulization 4 (four) times daily as needed for wheezing or shortness of breath.     lidocaine-prilocaine (EMLA) cream Apply 1 application topically as needed. Apply on skin over port site 1-2 hours prior to treatment.  Cove with plastic wrap. 30 g 0   losartan (COZAAR) 25 MG tablet Take 1 tablet (25 mg total) by mouth daily. 90 tablet 3   omeprazole (PRILOSEC) 20 MG capsule Take by mouth.  OXYGEN Inhale 2-3 L into the lungs.     pravastatin (PRAVACHOL) 80 MG tablet TAKE ONE TABLET BY MOUTH ONCE DAILY IN THE EVENING     predniSONE (DELTASONE) 10 MG tablet Take 10 mg by mouth daily.     prochlorperazine (COMPAZINE) 10 MG tablet Take 1 tablet (10 mg total) by mouth every 6 (six) hours as needed for nausea or vomiting. 30 tablet 0   torsemide (DEMADEX) 20 MG tablet TAKE 20  MG DAILY MAY TAKE ADDITIONAL 20 MG AS NEEDED FOR SWELLING 180 tablet 1   triamcinolone (NASACORT) 55 MCG/ACT AERO nasal inhaler Place into the nose.     No current facility-administered medications for this visit.     SURGICAL HISTORY:  Past Surgical History:  Procedure Laterality Date   APPENDECTOMY     arthroscopc     total knee   BACK SURGERY     FOOT SURGERY     IR IMAGING GUIDED PORT INSERTION  05/25/2018   knee surgery bilateral     PARTIAL HYSTERECTOMY     ruptured disk      REVIEW OF SYSTEMS:  A comprehensive review of systems was negative except for: Constitutional: positive for fatigue Respiratory: positive for dyspnea on exertion   PHYSICAL EXAMINATION: General appearance: alert, cooperative, fatigued and no distress Head: Normocephalic, without obvious abnormality, atraumatic Neck: no adenopathy, no JVD, supple, symmetrical, trachea midline and thyroid not enlarged, symmetric, no tenderness/mass/nodules Lymph nodes: Cervical, supraclavicular, and axillary nodes normal. Resp: clear to auscultation bilaterally Back: symmetric, no curvature. ROM normal. No CVA tenderness. Cardio: regular rate and rhythm, S1, S2 normal, no murmur, click, rub or gallop GI: soft, non-tender; bowel sounds normal; no masses,  no organomegaly Extremities: extremities normal, atraumatic, no cyanosis or edema  ECOG PERFORMANCE STATUS: 1 - Symptomatic but completely ambulatory  Blood pressure 130/71, pulse 78, temperature 98 F (36.7 C), temperature source Oral, resp. rate 18, height 5\' 4"  (1.626 m), weight 136 lb 9 oz (61.9 kg), SpO2 96 %.  LABORATORY DATA: Lab Results  Component Value Date   WBC 13.9 (H) 09/20/2018   HGB 10.7 (L) 09/20/2018   HCT 34.1 (L) 09/20/2018   MCV 100.0 09/20/2018   PLT 298 09/20/2018      Chemistry      Component Value Date/Time   NA 143 08/30/2018 1022   K 4.3 08/30/2018 1022   CL 110 08/30/2018 1022   CO2 23 08/30/2018 1022   BUN 17  08/30/2018 1022   CREATININE 1.06 (H) 08/30/2018 1022      Component Value Date/Time   CALCIUM 8.1 (L) 08/30/2018 1022   ALKPHOS 64 08/30/2018 1022   AST 13 (L) 08/30/2018 1022   ALT 10 08/30/2018 1022   BILITOT <0.2 (L) 08/30/2018 1022       RADIOGRAPHIC STUDIES: No results found.  ASSESSMENT AND PLAN: This is a very pleasant 78 years old white female with extensive stage small cell lung cancer diagnosed in February 2020 and currently undergoing systemic chemotherapy with carboplatin, etoposide and Tecentriq status post 6 cycles.  Starting from cycle #5 she is on treatment with Tecentriq as maintenance therapy status post 2 cycles. The patient continues to tolerate this treatment well with no concerning adverse effects. I recommended for her to proceed with cycle #3 of the maintenance therapy today. I will see her back for follow-up visit in 3 weeks for evaluation after repeating CT scan of the chest, abdomen and pelvis for restaging of her disease. The patient was  advised to call immediately if she has any concerning symptoms in the interval. The patient voices understanding of current disease status and treatment options and is in agreement with the current care plan. All questions were answered. The patient knows to call the clinic with any problems, questions or concerns. We can certainly see the patient much sooner if necessary.  I spent 10 minutes counseling the patient face to face. The total time spent in the appointment was 15 minutes.  Disclaimer: This note was dictated with voice recognition software. Similar sounding words can inadvertently be transcribed and may not be corrected upon review.

## 2018-09-20 NOTE — Patient Instructions (Signed)
[facility] Discharge Instructions for Patients Receiving Chemotherapy  Today you received the following chemotherapy agents : Tecentriq.  To help prevent nausea and vomiting after your treatment, we encourage you to take your nausea medication as prescribed.   If you develop nausea and vomiting that is not controlled by your nausea medication, call the clinic.   BELOW ARE SYMPTOMS THAT SHOULD BE REPORTED IMMEDIATELY:  *FEVER GREATER THAN 100.5 F  *CHILLS WITH OR WITHOUT FEVER  NAUSEA AND VOMITING THAT IS NOT CONTROLLED WITH YOUR NAUSEA MEDICATION  *UNUSUAL SHORTNESS OF BREATH  *UNUSUAL BRUISING OR BLEEDING  TENDERNESS IN MOUTH AND THROAT WITH OR WITHOUT PRESENCE OF ULCERS  *URINARY PROBLEMS  *BOWEL PROBLEMS  UNUSUAL RASH Items with * indicate a potential emergency and should be followed up as soon as possible.  Feel free to call the clinic should you have any questions or concerns. The clinic phone number is [phone].  Please show the [facility] at check-in to the Emergency Department and triage nurse.    Coronavirus (COVID-19) Are you at risk?  Are you at risk for the Coronavirus (COVID-19)?  To be considered HIGH RISK for Coronavirus (COVID-19), you have to meet the following criteria:  . Traveled to [place], [place], [place], [place] or [place]; or in the [place] to [place], [place], [place], or [place]; and have fever, cough, and shortness of breath within the last 2 weeks of travel OR . Been in close contact with a person diagnosed with COVID-19 within the last 2 weeks and have fever, cough, and shortness of breath . IF YOU DO NOT MEET THESE CRITERIA, YOU ARE CONSIDERED LOW RISK FOR COVID-19.  What to do if you are HIGH RISK for COVID-19?  Marland Kitchen If you are having a medical emergency, call 911. . Seek medical care right away. Before you go to a doctor's office, urgent care or emergency department, call ahead and tell them about  your recent travel, contact with someone diagnosed with COVID-19, and your symptoms. You should receive instructions from your physician's office regarding next steps of care.  . When you arrive at healthcare provider, tell the healthcare staff immediately you have returned from visiting [place], [place], [place], [place] or [place]; or traveled in the [place] to [place], [place], [place], or [place]; in the last two weeks or you have been in close contact with a person diagnosed with COVID-19 in the last 2 weeks.   . Tell the health care staff about your symptoms: fever, cough and shortness of breath. . After you have been seen by a medical provider, you will be either: o Tested for (COVID-19) and discharged home on quarantine except to seek medical care if symptoms worsen, and asked to  - Stay home and avoid contact with others until you get your results (4-5 days)  - Avoid travel on public transportation if possible (such as bus, train, or airplane) or o Sent to the Emergency Department by EMS for evaluation, COVID-19 testing, and possible admission depending on your condition and test results.  What to do if you are LOW RISK for COVID-19?  Reduce your risk of any infection by using the same precautions used for avoiding the common cold or flu:  Marland Kitchen Wash your hands often with soap and warm water for at least 20 seconds.  If soap and water are not readily available, use an alcohol-based hand sanitizer with at least 60% alcohol.  . If  coughing or sneezing, cover your mouth and nose by coughing or sneezing into the elbow areas of your shirt or coat, into a tissue or into your sleeve (not your hands). . Avoid shaking hands with others and consider head nods or verbal greetings only. . Avoid touching your eyes, nose, or mouth with unwashed hands.  . Avoid close contact with people who are sick. . Avoid places or events with large numbers of people in one location, like concerts or sporting  events. . Carefully consider travel plans you have or are making. . If you are planning any travel outside or inside the [place], visit the CDC's Travelers' Health webpage for the latest health notices. . If you have some symptoms but not all symptoms, continue to monitor at home and seek medical attention if your symptoms worsen. . If you are having a medical emergency, call 911.   [facility] / e-Visit: [email or web address]         [facility]: [facility]: [phone]                   [facility]: [phone]

## 2018-09-21 ENCOUNTER — Telehealth: Payer: Self-pay | Admitting: Internal Medicine

## 2018-09-21 NOTE — Telephone Encounter (Signed)
Scheduled appt per 7/20 los - added additional cycles - pt to get an updated schedule next visit/

## 2018-10-05 ENCOUNTER — Emergency Department (HOSPITAL_COMMUNITY): Payer: Medicare Other

## 2018-10-05 ENCOUNTER — Encounter (HOSPITAL_COMMUNITY): Payer: Self-pay

## 2018-10-05 ENCOUNTER — Other Ambulatory Visit: Payer: Self-pay

## 2018-10-05 ENCOUNTER — Emergency Department (HOSPITAL_COMMUNITY)
Admission: EM | Admit: 2018-10-05 | Discharge: 2018-11-02 | Disposition: E | Payer: Medicare Other | Attending: Emergency Medicine | Admitting: Emergency Medicine

## 2018-10-05 DIAGNOSIS — Y999 Unspecified external cause status: Secondary | ICD-10-CM | POA: Diagnosis not present

## 2018-10-05 DIAGNOSIS — Z87891 Personal history of nicotine dependence: Secondary | ICD-10-CM | POA: Diagnosis not present

## 2018-10-05 DIAGNOSIS — Z20828 Contact with and (suspected) exposure to other viral communicable diseases: Secondary | ICD-10-CM | POA: Insufficient documentation

## 2018-10-05 DIAGNOSIS — M545 Low back pain: Secondary | ICD-10-CM | POA: Insufficient documentation

## 2018-10-05 DIAGNOSIS — Y92019 Unspecified place in single-family (private) house as the place of occurrence of the external cause: Secondary | ICD-10-CM | POA: Insufficient documentation

## 2018-10-05 DIAGNOSIS — I469 Cardiac arrest, cause unspecified: Secondary | ICD-10-CM | POA: Diagnosis not present

## 2018-10-05 DIAGNOSIS — J9622 Acute and chronic respiratory failure with hypercapnia: Secondary | ICD-10-CM | POA: Diagnosis not present

## 2018-10-05 DIAGNOSIS — R0600 Dyspnea, unspecified: Secondary | ICD-10-CM | POA: Diagnosis not present

## 2018-10-05 DIAGNOSIS — J9601 Acute respiratory failure with hypoxia: Secondary | ICD-10-CM

## 2018-10-05 DIAGNOSIS — S72002A Fracture of unspecified part of neck of left femur, initial encounter for closed fracture: Secondary | ICD-10-CM

## 2018-10-05 DIAGNOSIS — I10 Essential (primary) hypertension: Secondary | ICD-10-CM | POA: Diagnosis not present

## 2018-10-05 DIAGNOSIS — J9621 Acute and chronic respiratory failure with hypoxia: Secondary | ICD-10-CM | POA: Diagnosis present

## 2018-10-05 DIAGNOSIS — C3490 Malignant neoplasm of unspecified part of unspecified bronchus or lung: Secondary | ICD-10-CM | POA: Insufficient documentation

## 2018-10-05 DIAGNOSIS — J449 Chronic obstructive pulmonary disease, unspecified: Secondary | ICD-10-CM | POA: Diagnosis present

## 2018-10-05 DIAGNOSIS — S72042A Displaced fracture of base of neck of left femur, initial encounter for closed fracture: Secondary | ICD-10-CM | POA: Insufficient documentation

## 2018-10-05 DIAGNOSIS — S79912A Unspecified injury of left hip, initial encounter: Secondary | ICD-10-CM | POA: Diagnosis present

## 2018-10-05 DIAGNOSIS — Y939 Activity, unspecified: Secondary | ICD-10-CM | POA: Insufficient documentation

## 2018-10-05 DIAGNOSIS — W19XXXA Unspecified fall, initial encounter: Secondary | ICD-10-CM | POA: Diagnosis not present

## 2018-10-05 DIAGNOSIS — Z85118 Personal history of other malignant neoplasm of bronchus and lung: Secondary | ICD-10-CM

## 2018-10-05 DIAGNOSIS — C3491 Malignant neoplasm of unspecified part of right bronchus or lung: Secondary | ICD-10-CM | POA: Diagnosis present

## 2018-10-05 DIAGNOSIS — Z79899 Other long term (current) drug therapy: Secondary | ICD-10-CM | POA: Diagnosis not present

## 2018-10-05 LAB — COMPREHENSIVE METABOLIC PANEL
ALT: 25 U/L (ref 0–44)
AST: 27 U/L (ref 15–41)
Albumin: 3.2 g/dL — ABNORMAL LOW (ref 3.5–5.0)
Alkaline Phosphatase: 66 U/L (ref 38–126)
Anion gap: 13 (ref 5–15)
BUN: 17 mg/dL (ref 8–23)
CO2: 25 mmol/L (ref 22–32)
Calcium: 8.9 mg/dL (ref 8.9–10.3)
Chloride: 92 mmol/L — ABNORMAL LOW (ref 98–111)
Creatinine, Ser: 1.08 mg/dL — ABNORMAL HIGH (ref 0.44–1.00)
GFR calc Af Amer: 57 mL/min — ABNORMAL LOW (ref >60)
GFR calc non Af Amer: 49 mL/min — ABNORMAL LOW (ref >60)
Glucose, Bld: 221 mg/dL — ABNORMAL HIGH (ref 70–99)
Potassium: 5.1 mmol/L (ref 3.5–5.1)
Sodium: 130 mmol/L — ABNORMAL LOW (ref 135–145)
Total Bilirubin: 0.5 mg/dL (ref 0.3–1.2)
Total Protein: 6.2 g/dL — ABNORMAL LOW (ref 6.5–8.1)

## 2018-10-05 LAB — BLOOD GAS, ARTERIAL
Acid-base deficit: 12.8 mmol/L — ABNORMAL HIGH (ref 0.0–2.0)
Bicarbonate: 16.2 mmol/L — ABNORMAL LOW (ref 20.0–28.0)
Drawn by: 422461
FIO2: 100
MECHVT: 420 mL
O2 Saturation: 98.8 %
PEEP: 5 cmH2O
Patient temperature: 37.2
RATE: 14 resp/min
pCO2 arterial: 54.3 mmHg — ABNORMAL HIGH (ref 32.0–48.0)
pH, Arterial: 7.105 — CL (ref 7.350–7.450)
pO2, Arterial: 246 mmHg — ABNORMAL HIGH (ref 83.0–108.0)

## 2018-10-05 LAB — URINALYSIS, ROUTINE W REFLEX MICROSCOPIC
Bilirubin Urine: NEGATIVE
Glucose, UA: NEGATIVE mg/dL
Hgb urine dipstick: NEGATIVE
Ketones, ur: NEGATIVE mg/dL
Leukocytes,Ua: NEGATIVE
Nitrite: NEGATIVE
Protein, ur: NEGATIVE mg/dL
Specific Gravity, Urine: 1.02 (ref 1.005–1.030)
pH: 5 (ref 5.0–8.0)

## 2018-10-05 LAB — CBC WITH DIFFERENTIAL/PLATELET
Abs Immature Granulocytes: 0.23 10*3/uL — ABNORMAL HIGH (ref 0.00–0.07)
Basophils Absolute: 0.1 10*3/uL (ref 0.0–0.1)
Basophils Relative: 0 %
Eosinophils Absolute: 0.3 10*3/uL (ref 0.0–0.5)
Eosinophils Relative: 1 %
HCT: 36.5 % (ref 36.0–46.0)
Hemoglobin: 11.5 g/dL — ABNORMAL LOW (ref 12.0–15.0)
Immature Granulocytes: 1 %
Lymphocytes Relative: 8 %
Lymphs Abs: 1.9 10*3/uL (ref 0.7–4.0)
MCH: 31.6 pg (ref 26.0–34.0)
MCHC: 31.5 g/dL (ref 30.0–36.0)
MCV: 100.3 fL — ABNORMAL HIGH (ref 80.0–100.0)
Monocytes Absolute: 1.4 10*3/uL — ABNORMAL HIGH (ref 0.1–1.0)
Monocytes Relative: 5 %
Neutro Abs: 21.6 10*3/uL — ABNORMAL HIGH (ref 1.7–7.7)
Neutrophils Relative %: 85 %
Platelets: 277 10*3/uL (ref 150–400)
RBC: 3.64 MIL/uL — ABNORMAL LOW (ref 3.87–5.11)
RDW: 13.3 % (ref 11.5–15.5)
WBC: 25.5 10*3/uL — ABNORMAL HIGH (ref 4.0–10.5)
nRBC: 0 % (ref 0.0–0.2)

## 2018-10-05 LAB — BRAIN NATRIURETIC PEPTIDE: B Natriuretic Peptide: 478.1 pg/mL — ABNORMAL HIGH (ref 0.0–100.0)

## 2018-10-05 LAB — LACTIC ACID, PLASMA: Lactic Acid, Venous: 4.6 mmol/L (ref 0.5–1.9)

## 2018-10-05 LAB — SARS CORONAVIRUS 2 BY RT PCR (HOSPITAL ORDER, PERFORMED IN ~~LOC~~ HOSPITAL LAB): SARS Coronavirus 2: NEGATIVE

## 2018-10-05 MED ORDER — EPINEPHRINE 1 MG/10ML IJ SOSY
PREFILLED_SYRINGE | INTRAMUSCULAR | Status: AC | PRN
  Administered 2018-10-05: 1 mg via INTRAVENOUS

## 2018-10-05 MED ORDER — SODIUM CHLORIDE 0.9 % IV BOLUS: 2000.0000 mL | Freq: Once | INTRAVENOUS | Status: DC

## 2018-10-05 MED ORDER — MIDAZOLAM HCL 2 MG/2ML IJ SOLN
INTRAMUSCULAR | Status: AC
  Filled 2018-10-05: qty 2

## 2018-10-05 MED ORDER — MIDAZOLAM HCL 2 MG/2ML IJ SOLN
2.0000 mg | Freq: Once | INTRAMUSCULAR | Status: AC
  Administered 2018-10-05: 2 mg via INTRAVENOUS

## 2018-10-05 MED ORDER — ALBUTEROL SULFATE HFA 108 (90 BASE) MCG/ACT IN AERS
6.0000 | INHALATION_SPRAY | RESPIRATORY_TRACT | Status: DC | PRN
  Administered 2018-10-05: 6 via RESPIRATORY_TRACT
  Filled 2018-10-05: qty 6.7

## 2018-10-05 MED ORDER — NOREPINEPHRINE 4 MG/250ML-% IV SOLN
INTRAVENOUS | Status: AC | PRN
  Administered 2018-10-05: 5 ug/min via INTRAVENOUS

## 2018-10-05 MED ORDER — SODIUM CHLORIDE 0.9 % IV SOLN
1.0000 g | Freq: Once | INTRAVENOUS | Status: AC
  Administered 2018-10-05: 1 g via INTRAVENOUS
  Filled 2018-10-05: qty 1

## 2018-10-05 MED ORDER — MAGNESIUM SULFATE 50 % IJ SOLN: 2.0000 g | Freq: Once | INTRAMUSCULAR | Status: DC

## 2018-10-05 MED ORDER — SUCCINYLCHOLINE CHLORIDE 20 MG/ML IJ SOLN
INTRAMUSCULAR | Status: AC | PRN
  Administered 2018-10-05: 150 mg via INTRAVENOUS

## 2018-10-05 MED ORDER — METHYLPREDNISOLONE SODIUM SUCC 125 MG IJ SOLR
125.0000 mg | Freq: Once | INTRAMUSCULAR | Status: AC
  Administered 2018-10-05: 125 mg via INTRAVENOUS
  Filled 2018-10-05: qty 2

## 2018-10-05 MED ORDER — FENTANYL 2500MCG IN NS 250ML (10MCG/ML) PREMIX INFUSION
0.0000 ug/h | INTRAVENOUS | Status: DC
  Administered 2018-10-05: 06:00:00 25 ug/h via INTRAVENOUS
  Filled 2018-10-05: qty 250

## 2018-10-05 MED ORDER — VANCOMYCIN HCL IN DEXTROSE 1-5 GM/200ML-% IV SOLN: 1000.0000 mg | Freq: Once | INTRAVENOUS | Status: DC

## 2018-10-05 MED ORDER — EPINEPHRINE PF 1 MG/ML IJ SOLN
0.5000 ug/min | INTRAVENOUS | Status: DC
  Filled 2018-10-05: qty 4

## 2018-10-05 MED ORDER — HYDROMORPHONE HCL 1 MG/ML IJ SOLN
1.0000 mg | Freq: Once | INTRAMUSCULAR | Status: DC
  Filled 2018-10-05: qty 1

## 2018-10-05 MED ORDER — MAGNESIUM SULFATE 2 GM/50ML IV SOLN
2.0000 g | Freq: Once | INTRAVENOUS | Status: AC
  Administered 2018-10-05: 04:00:00 2 g via INTRAVENOUS
  Filled 2018-10-05: qty 50

## 2018-10-05 MED ORDER — SODIUM BICARBONATE 8.4 % IV SOLN
INTRAVENOUS | Status: AC | PRN
  Administered 2018-10-05: 50 meq via INTRAVENOUS

## 2018-10-05 MED ORDER — ETOMIDATE 2 MG/ML IV SOLN
INTRAVENOUS | Status: AC | PRN
  Administered 2018-10-05: 20 mg via INTRAVENOUS

## 2018-10-05 MED FILL — Medication: Qty: 1 | Status: AC

## 2018-10-06 LAB — URINE CULTURE: Culture: NO GROWTH

## 2018-10-08 ENCOUNTER — Ambulatory Visit (HOSPITAL_COMMUNITY): Admission: RE | Admit: 2018-10-08 | Payer: Medicare Other | Source: Ambulatory Visit

## 2018-10-10 LAB — CULTURE, BLOOD (ROUTINE X 2)
Culture: NO GROWTH
Special Requests: ADEQUATE

## 2018-10-11 ENCOUNTER — Other Ambulatory Visit: Payer: Medicare Other

## 2018-10-11 ENCOUNTER — Ambulatory Visit: Payer: Medicare Other

## 2018-10-11 ENCOUNTER — Ambulatory Visit: Payer: Medicare Other | Admitting: Internal Medicine

## 2018-11-01 ENCOUNTER — Ambulatory Visit: Payer: Medicare Other | Admitting: Internal Medicine

## 2018-11-01 ENCOUNTER — Other Ambulatory Visit: Payer: Medicare Other

## 2018-11-01 ENCOUNTER — Ambulatory Visit: Payer: Medicare Other

## 2018-11-02 NOTE — ED Notes (Signed)
Dr. Stark Jock at bedside, pt becoming bradycardic. No pulse felt, code called and CPR started

## 2018-11-02 NOTE — ED Notes (Signed)
Pulses back

## 2018-11-02 NOTE — ED Triage Notes (Signed)
Pt arrived via [facility] after a fall at home tonight. Pt complaining of left hip and lower back pain. Pt has a history of Asthma and COPD, uses 2L at home but on 6L from EMS

## 2018-11-02 NOTE — ED Notes (Signed)
Dr. Stark Jock at bedside, charge nurse Terri bagging pt with ambu bag.

## 2018-11-02 NOTE — ED Provider Notes (Addendum)
[facility] DEPT Provider Note   CSN: [id number] Arrival date & time: Oct 31, 2018  3614     History   Chief Complaint Chief Complaint  Patient presents with  . Fall    HPI [name] is a 78 y.o. female.     Patient is a 78 year old female with past medical history of COPD on home oxygen.  She is brought by EMS for evaluation of fall at home.  Patient apparently became weak this evening, then fell and injured her left hip.  She was unable to ambulate and 911 was called.  EMS found the patient to be in significant discomfort and also having difficulty breathing.  Patient states that her breathing began to worsen yesterday and is rapidly worsening.  She does report some congestion and cough.  She denies any fevers or chills.  She denies any ill contacts.  The history is provided by the patient.  Fall This is a new problem. The current episode started less than 1 hour ago. The problem occurs constantly. The problem has not changed since onset.Associated symptoms include shortness of breath. Exacerbated by: Movement and palpation. Nothing relieves the symptoms. She has tried nothing for the symptoms.    Past Medical History:  Diagnosis Date  . COPD (chronic obstructive pulmonary disease) ([facility])   . Dyspnea   . GERD (gastroesophageal reflux disease)   . On home oxygen therapy   . Tachycardia     Patient Active Problem List   Diagnosis Date Noted  . Encounter for antineoplastic chemotherapy 05/08/2018  . Encounter for antineoplastic immunotherapy 05/08/2018  . Goals of care, counseling/discussion 05/08/2018  . Primary small cell carcinoma of lung, right ([facility]) 05/07/2018  . Acute sinusitis 04/22/2016  . Perennial and seasonal allergic rhinitis 10/16/2015  . Allergic conjunctivitis 10/16/2015  . ANXIETY STATE, UNSPECIFIED 10/17/2008  . Essential hypertension 10/17/2008  . COPD (chronic obstructive pulmonary disease) ([facility]) 10/17/2008  . INSOMNIA  10/17/2008  . DYSPNEA 10/17/2008  . TACHYCARDIA 10/16/2008    Past Surgical History:  Procedure Laterality Date  . APPENDECTOMY    . arthroscopc     total knee  . BACK SURGERY    . FOOT SURGERY    . IR IMAGING GUIDED PORT INSERTION  05/25/2018  . knee surgery bilateral    . PARTIAL HYSTERECTOMY    . ruptured disk       OB History   No obstetric history on file.      Home Medications    Prior to Admission medications   Medication Sig Start Date End Date Taking? Authorizing Provider  acetaminophen (TYLENOL) 325 MG tablet Take by mouth. arthritis    [provider]  albuterol (PROVENTIL HFA;VENTOLIN HFA) 108 (90 Base) MCG/ACT inhaler Inhale into the lungs every 6 (six) hours as needed for wheezing or shortness of breath.    [provider]  Azelastine HCl 0.15 % SOLN 1-2 sprays each nostril 2 times per day as needed. 10/16/15   Bobbitt, Sedalia Muta, MD  benzonatate (TESSALON) 100 MG capsule TAKE 1 CAPSULE BY MOUTH THREE TIMES DAILY AS NEEDED FOR COUGH UP TO FOR 10 DAYS 08/13/18   [provider]  budesonide-formoterol (SYMBICORT) 160-4.5 MCG/ACT inhaler Inhale 2 puffs into the lungs 2 (two) times daily.    [provider]  carvedilol (COREG) 6.25 MG tablet Take 1 tablet (6.25 mg total) by mouth 2 (two) times daily with a meal. 01/12/18   Branch, Alphonse Guild, MD  diphenhydrAMINE (BENADRYL) 25 MG  tablet Take by mouth. As needed for allergies    [provider]  gabapentin (NEURONTIN) 300 MG capsule Take 300 mg by mouth 2 (two) times daily.  09/25/15   [provider]  ipratropium (ATROVENT) 0.02 % nebulizer solution Take 0.5 mg by nebulization 4 (four) times daily as needed for wheezing or shortness of breath.    [provider]  lidocaine-prilocaine (EMLA) cream Apply 1 application topically as needed. Apply on skin over port site 1-2 hours prior to treatment.  Cove with plastic wrap. 05/10/18   Curt Bears, MD  losartan  (COZAAR) 25 MG tablet Take 1 tablet (25 mg total) by mouth daily. 01/12/18   Arnoldo Lenis, MD  omeprazole (PRILOSEC) 20 MG capsule Take by mouth.    [provider]  OXYGEN Inhale 2-3 L into the lungs.    [provider]  pravastatin (PRAVACHOL) 80 MG tablet TAKE ONE TABLET BY MOUTH ONCE DAILY IN THE EVENING 06/21/15   [provider]  predniSONE (DELTASONE) 10 MG tablet Take 10 mg by mouth daily. 04/21/18   [provider]  prochlorperazine (COMPAZINE) 10 MG tablet Take 1 tablet (10 mg total) by mouth every 6 (six) hours as needed for nausea or vomiting. 05/08/18   Curt Bears, MD  torsemide (DEMADEX) 20 MG tablet TAKE 20 MG DAILY MAY TAKE ADDITIONAL 20 MG AS NEEDED FOR SWELLING 01/12/18   Arnoldo Lenis, MD  triamcinolone (NASACORT) 55 MCG/ACT AERO nasal inhaler Place into the nose.    [provider]    Family History Family History  Problem Relation Age of Onset  . Coronary artery disease Other     Social History Social History   Tobacco Use  . Smoking status: Former Smoker    Quit date: 10/16/2011    Years since quitting: 6.9  . Smokeless tobacco: Never Used  Substance Use Topics  . Alcohol use: No  . Drug use: No     Allergies   Morphine, Oxycodone-acetaminophen, Celecoxib, Ciprofloxacin, Codeine, Iodine, Levaquin  [levofloxacin in d5w], Lisinopril, Losartan, Midazolam, Nsaids, Penicillins, Statins, Atorvastatin, Citalopram, Colesevelam, Fenofibrate, Tramadol hcl, Aleve [naproxen sodium], Azithromycin, Cyclobenzaprine, Erythromycin, Fosamax [alendronate], Meloxicam, Moxifloxacin, and Sulfonamide derivatives   Review of Systems Review of Systems  Respiratory: Positive for shortness of breath.   All other systems reviewed and are negative.    Physical Exam Updated Vital Signs LMP  (LMP Unknown) Comment: Menopause  SpO2 (!) 88%   Physical Exam Vitals signs and nursing note reviewed.  Constitutional:       General: She is in acute distress.     Appearance: She is well-developed. She is not diaphoretic.     Comments: Patient is a chronically ill-appearing female.  She is in mild respiratory distress.  HENT:     Head: Normocephalic and atraumatic.  Neck:     Musculoskeletal: Normal range of motion and neck supple.  Cardiovascular:     Rate and Rhythm: Normal rate and regular rhythm.     Heart sounds: No murmur. No friction rub. No gallop.   Pulmonary:     Effort: Respiratory distress present.     Breath sounds: Rhonchi present.  Abdominal:     General: Bowel sounds are normal. There is no distension.     Palpations: Abdomen is soft.     Tenderness: There is no abdominal tenderness.  Musculoskeletal: Normal range of motion.     Comments: There is tenderness to palpation over the lateral aspect of the left hip.  She has pain with range of motion of the left leg.  There is no obvious shortening or rotation.  DP pulses are easily palpable and motor and sensation are intact throughout the entire leg and foot.  Skin:    General: Skin is warm and dry.  Neurological:     Mental Status: She is alert and oriented to person, place, and time.      ED Treatments / Results  Labs (all labs ordered are listed, but only abnormal results are displayed) Labs Reviewed  SARS CORONAVIRUS 2 (HOSPITAL ORDER, [facility])  COMPREHENSIVE METABOLIC PANEL  CBC WITH DIFFERENTIAL/PLATELET  BRAIN NATRIURETIC PEPTIDE    EKG None  Radiology No results found.  Procedures Procedures (including critical care time)  Medications Ordered in ED Medications  methylPREDNISolone sodium succinate (SOLU-MEDROL) 125 mg/2 mL injection 125 mg (has no administration in time range)  magnesium sulfate (IV Push/IM) injection 2 g (has no administration in time range)  albuterol (VENTOLIN HFA) 108 (90 Base) MCG/ACT inhaler 6 puff (has no administration in time range)  HYDROmorphone (DILAUDID)  injection 1 mg (has no administration in time range)     Initial Impression / Assessment and Plan / ED Course  I have reviewed the triage vital signs and the nursing notes.  Pertinent labs & imaging results that were available during my care of the patient were reviewed by me and considered in my medical decision making (see chart for details).  [name] is a 78 year old female with a history of lung cancer and COPD.  She presents today for evaluation of fall.  According to the husband, she has been more short of breath for the past several days.  This evening she attempted to walk to the bathroom when she fell and injured her left hip.  EMS was called and the patient was transported here.  Patient arrived here in mild respiratory distress complaining of left hip pain.  She was awake, alert, and appropriate upon arrival.  Patient went for x-rays of the chest and left hip.  This revealed a femoral neck fracture and the suggestion of bronchopneumonia.  Shortly after returning from radiology, it was brought to my attention that the patient was less alert and was having increased difficulty breathing.  I went to the room to find her obtunded and unresponsive.  Patient was ventilated with a bag valve mask.  Patient began to become bradycardic and CPR was initiated.  Patient was given epinephrine and atropine with return of blood pressure and circulation.  She was then intubated by RSI using succinylcholine and etomidate.  The 7.5 endotracheal tube was easily placed using the glide scope.  Tube placement was confirmed with direct visualization, end-tidal CO2, and direct auscultation over the stomach and lungs.  Additional work-up was obtained including blood cultures, laboratory studies, and urinalysis.  Patient had a white count of 25,000 and vancomycin and cefepime were given.  COVID testing was negative.  While awaiting critical care consultation, the patient experienced asystole and required CPR  and epinephrine on several occasions.  I had lengthy conversation with the family regarding the patient's prognosis.  As this is a patient with history of cancer, advanced COPD, and now a left hip fracture, I informed them that the chances of her making a meaningful recovery are very slim.  The husband seem to be receptive to making her comfort care, however was hoping the other son could see her before she passes away.  Patient also seen  by critical care who agrees with my assessment that this patient should be made comfort measures and allow nature to take its course.  CRITICAL CARE Performed by: Veryl Speak Total critical care time: 70 minutes Critical care time was exclusive of separately billable procedures and treating other patients. Critical care was necessary to treat or prevent imminent or life-threatening deterioration. Critical care was time spent personally by me on the following activities: development of treatment plan with patient and/or surrogate as well as nursing, discussions with consultants, evaluation of patient's response to treatment, examination of patient, obtaining history from patient or surrogate, ordering and performing treatments and interventions, ordering and review of laboratory studies, ordering and review of radiographic studies, pulse oximetry and re-evaluation of patient's condition.  Addendum: Patient began to bradycardia down, then ultimately went into V. fib, then asystole.  Patient pronounced expired at 07:18.  Husband and 2 sons were at bedside and I personally informed them.  According to the husband, the patient's primary doctor, Dr. Edrick Oh has recently retired and she was due to see Dr. Luan Pulling.  As patient is between physicians, death certificate was filled out by myself.  Final Clinical Impressions(s) / ED Diagnoses   Final diagnoses:  None    ED Discharge Orders    None       Veryl Speak, MD October 20, 2018 0177    Veryl Speak, MD  2018-10-20 [phone]

## 2018-11-02 NOTE — Code Documentation (Signed)
Pt intubated (7.5)

## 2018-11-02 NOTE — Consult Note (Signed)
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 The ED team has pushed several amps on epi to prevent another arrest. She is hypothermic on fentanyl low dose , does not respond to painful stimuli. Her extremities are cold and mottled.  I spoke to son and husband [name] and they have decided no further CPR or escalation of care, waiting for other son to arrive to make her comfortable.   Lines / Drains: chemo port   Cultures: COVID19 negative   Antibiotics:cefepime   Tests / Events: Multiple cardiac arrests, PEA in the ED    Past Medical History:  Diagnosis Date  . COPD (chronic obstructive pulmonary disease) ([place])   . Dyspnea   . GERD (gastroesophageal reflux disease)   . On home oxygen therapy   . Tachycardia     Past Surgical History:  Procedure Laterality Date  . APPENDECTOMY    . arthroscopc     total knee  . BACK SURGERY    . FOOT SURGERY    . IR IMAGING GUIDED PORT INSERTION  05/25/2018  . knee surgery bilateral    . PARTIAL HYSTERECTOMY    . ruptured disk      Prior to Admission medications   Medication Sig Start Date End Date Taking? Authorizing Provider  acetaminophen (TYLENOL) 325 MG tablet Take by mouth. arthritis    [provider]  albuterol (PROVENTIL HFA;VENTOLIN HFA) 108 (90 Base) MCG/ACT inhaler Inhale into the lungs every 6 (six) hours as needed for wheezing or shortness of breath.    [provider]  Azelastine HCl 0.15 % SOLN 1-2 sprays each nostril 2 times per day as needed. 10/16/15   Bobbitt, Sedalia Muta, MD  benzonatate (TESSALON) 100 MG  capsule TAKE 1 CAPSULE BY MOUTH THREE TIMES DAILY AS NEEDED FOR COUGH UP TO FOR 10 DAYS 08/13/18   [provider]  budesonide-formoterol (SYMBICORT) 160-4.5 MCG/ACT inhaler Inhale 2 puffs into the lungs 2 (two) times daily.    [provider]  carvedilol (COREG) 6.25 MG tablet Take 1 tablet (6.25 mg total) by mouth 2 (two) times daily with a meal. 01/12/18   Branch, Alphonse Guild, MD  diphenhydrAMINE (BENADRYL) 25 MG tablet Take by mouth. As needed for allergies    [provider]  gabapentin (NEURONTIN) 300 MG capsule Take 300 mg by mouth 2 (two) times daily.  09/25/15   [provider]  ipratropium (ATROVENT) 0.02 % nebulizer solution Take 0.5 mg by nebulization 4 (four) times daily as needed for wheezing or shortness of breath.    [provider]  lidocaine-prilocaine (EMLA) cream Apply 1 application topically as needed. Apply on skin over port site 1-2 hours prior to treatment.  Cove with plastic wrap. 05/10/18   Curt Bears, MD  losartan (COZAAR) 25 MG tablet Take 1 tablet (25 mg total) by mouth daily. 01/12/18   Arnoldo Lenis, MD  omeprazole (PRILOSEC) 20 MG capsule Take by mouth.    [provider]  OXYGEN Inhale 2-3 L into the lungs.    [provider]  pravastatin (PRAVACHOL) 80 MG tablet TAKE ONE TABLET BY MOUTH ONCE DAILY IN THE EVENING 06/21/15   [provider]  predniSONE (DELTASONE) 10 MG tablet Take 10 mg by mouth daily. 04/21/18   [provider]  prochlorperazine (COMPAZINE) 10 MG tablet Take 1 tablet (10 mg total) by mouth every 6 (six) hours as needed for nausea or vomiting. 05/08/18   Curt Bears, MD  torsemide (DEMADEX) 20 MG tablet TAKE 20 MG DAILY MAY TAKE ADDITIONAL 20 MG AS NEEDED FOR SWELLING 01/12/18   Arnoldo Lenis, MD  triamcinolone (NASACORT) 55 MCG/ACT AERO nasal inhaler Place into the nose.    [provider]    Allergies Allergies  Allergen Reactions  . Morphine  Shortness Of Breath, Swelling and Rash  . Oxycodone-Acetaminophen Shortness Of Breath    Decreased O2 sats per patient and her husband  . Celecoxib Other (See Comments) and Rash    Makes stomach burn  . Ciprofloxacin Rash    Other reaction(s): Unknown  . Codeine Rash  . Iodine Other (See Comments) and Rash    Betadine blisters  . Levaquin  [Levofloxacin In D5w] Rash  . Lisinopril     Other reaction(s): Dizziness  . Losartan     Other reaction(s): Dizziness  . Midazolam Rash  . Nsaids Rash  . Penicillins Rash  . Statins     Other reaction(s): Dizziness  . Atorvastatin   . Citalopram   . Colesevelam   . Fenofibrate   . Tramadol Hcl   . Aleve [Naproxen Sodium] Rash  . Azithromycin Rash  . Cyclobenzaprine Rash  . Erythromycin Rash  . Fosamax [Alendronate] Rash  . Meloxicam Other (See Comments)    Makes stomach burn  . Moxifloxacin Rash  . Sulfonamide Derivatives Rash    Family History Family History  Problem Relation Age of Onset  . Coronary artery disease Other     Social History  reports that she quit smoking about 6 years ago. She has never used smokeless tobacco. She reports that she does not drink alcohol or use drugs.  Review Of Systems:   Vital Signs: Temp:  [97.5 F (36.4 C)-99.9 F (37.7 C)] 99.1 F (37.3 C) (08/04 0932) Pulse Rate:  [30-145] 121 (08/04 0621) Resp:  [14-38] 34 (08/04 0632) BP: (70-199)/(27-134) 73/51 (08/04 0632) SpO2:  [37 %-100 %] 90 % (08/04 0621) FiO2 (%):  [80 %-100 %] 80 % (08/04 0605) Weight:  [61.7 kg] 61.7 kg (08/04 0414) No intake/output data recorded.  Physical Examination: General: chronically ill , unresponsive on the vent Neuro: PERRLA , EOMI , does not respond to pain CV: tachycardic , s1 s1 n PULM:ronchi and crackles b/l GI:bs absent , soft , nd nt Extremities: mottled extremities , +1 DP pulses , +1 pitting edema in the LE b/l   Ventilator settings: Vent Mode: PRVC FiO2 (%):  [80 %-100 %] 80 % Set Rate:   [14 bmp-26 bmp] 26 bmp Vt Set:  [420 mL] 420 mL PEEP:  [5 cmH20] 5 cmH20 Plateau Pressure:  [14 cmH20] 14 cmH20  Labs    CBC Recent Labs  Lab 2018/10/24 0351  HGB 11.5*  HCT 36.5  WBC 25.5*  PLT 277     BMET Recent Labs  Lab 2018/10/24 0351  NA 130*  K 5.1  CL 92*  CO2 25  GLUCOSE 221*  BUN 17  CREATININE 1.08*  CALCIUM 8.9    No results for input(s): INR in the last 168 hours.  Recent Labs  Lab 03-Nov-2018 0540  PHART 7.105*  PCO2ART 54.3*  PO2ART 246*  HCO3 16.2*  O2SAT 98.8     Radiology: CXR - multilobar consolidations and left pleural effusion    Assessment and Plan: Principal Problem:   Cardiac arrest ([place]) Active Problems:   COPD (chronic obstructive pulmonary disease) ([facility])   Primary small cell carcinoma of lung, right ([facility])   Acute on chronic respiratory failure with hypoxia and hypercapnia ([facility])  Multiple PEA arrests in the ED. Acute on chronic resp failure with hypoxemia and hypercarbia on high dose pressors and requiring epi pushes not to arrest again. I suspect she has thromboembolic disease and most likely has a PE causing her severe hypoxemia and cardiac manifestations. 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  The plan is no escalation of care. She is DNR , once the son gets here they will make her comfortable.  Palliative care consult.  Best practices / Disposition: -->Code Status: DNR -->DVT Px: scd's -->GI Px: pepcid -->Diet:npo  CC time spent : 35 mins ,  D/w Husband [name] and ED staff at bedside.       11-03-18, 6:58 AM

## 2018-11-02 NOTE — ED Notes (Addendum)
This nurse was speaking with patient and gained permission to use her implanted port, pt stating she is having trouble breathing. PRN inhaler given and explained to patient that I have medication to help. Pt diaphoretic.

## 2018-11-02 NOTE — ED Notes (Signed)
Patient transported to X-ray 

## 2018-11-02 NOTE — ED Notes (Signed)
Pts became bradycardic, Charge nurse and this nurse found no pulse. CPR started and code called.

## 2018-11-02 NOTE — Code Documentation (Signed)
Compressions stopped heart rate 147

## 2018-11-02 NOTE — Progress Notes (Signed)
eLink Physician-Brief Progress Note Patient Name: [name] DOB: Feb 03, 1941 MRN: [id number]   Date of Service  Oct 19, 2018  HPI/Events of Note  78 year old female with a history of COPD, lung cancer on chemotherapy presented to the ER status post fall with a femoral neck fracture.  eICU Interventions  She developed respiratory failure in the ER requiring intubation.  She had a PEA cardiac arrest in the ER.  COVID-19 testing is pending.  Notified Dr. Claudie Leach.      Intervention Category Major Interventions: Respiratory failure - evaluation and management Intermediate Interventions: Communication with other healthcare providers and/or family Evaluation Type: New Patient Evaluation  [name] 10/19/18, 6:06 AM

## 2018-11-02 NOTE — Code Documentation (Signed)
CPR started.

## 2018-11-02 NOTE — ED Notes (Signed)
Critical care called at [phone]

## 2018-11-02 NOTE — ED Notes (Signed)
While accessing patients port, pt was verbal and responsive. This nurse noticed her become limp and eyes roll back. This nurse tried to arouse pt and she became agonal breathing. NT Lilli called for help and made provider aware.

## 2018-11-02 NOTE — ED Notes (Signed)
Family at bedside. 

## 2018-11-02 NOTE — ED Notes (Signed)
Patient's medical alert bracelet placed in specimen cup and sealed. Placed in labeled belonging bag.

## 2018-11-02 NOTE — ED Notes (Signed)
This nurse made aware by critical care pt is no longer a full code.

## 2018-11-02 NOTE — ED Notes (Signed)
ED Provider at bedside. 

## 2018-11-02 NOTE — ED Notes (Addendum)
CPR start for one round of compressions. Pulse returned.  Dr. Stark Jock at beside.

## 2018-11-02 NOTE — ED Notes (Addendum)
Remainder of fentanyl IV piggyback bag wasted in stericyle disposal system. Witnessed by Mable Fill, RN

## 2018-11-02 DEATH — deceased

## 2018-11-22 ENCOUNTER — Other Ambulatory Visit: Payer: Medicare Other

## 2018-11-22 ENCOUNTER — Ambulatory Visit: Payer: Medicare Other

## 2018-11-22 ENCOUNTER — Ambulatory Visit: Payer: Medicare Other | Admitting: Internal Medicine

## 2020-12-21 IMAGING — CT NM PET TUM IMG INITIAL (PI) SKULL BASE T - THIGH
7 of 8 series · 18 of 25 positions shown · non-contrast
Comparison: CT chest 04/07/18

CLINICAL DATA: Initial treatment strategy for lung cancer.

EXAM:
NUCLEAR MEDICINE PET SKULL BASE TO THIGH
TECHNIQUE: 7.8 mCi F-18 FDG was injected intravenously. Full-ring PET imaging
was performed from the skull base to thigh after the radiotracer. CT
data was obtained and used for attenuation correction and anatomic
localization.
Fasting blood glucose: 115 mg/dl

[Series 3: pet sk_thigh ac · axial · 5.0mm · 4.07mm/px · z∈[-673,+139]mm · 3 of 204 slices shown]
[im 1/204]
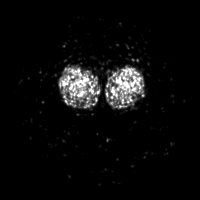
[im 136/204]
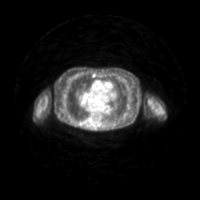
[im 204/204]
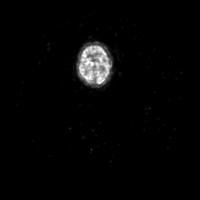

[Series 4: ct sk_thigh 5.0 b31f · axial · 5.0mm · 0.98mm/px · z∈[-673,-65]mm · 3 of 204 slices shown]
[im 1/204]
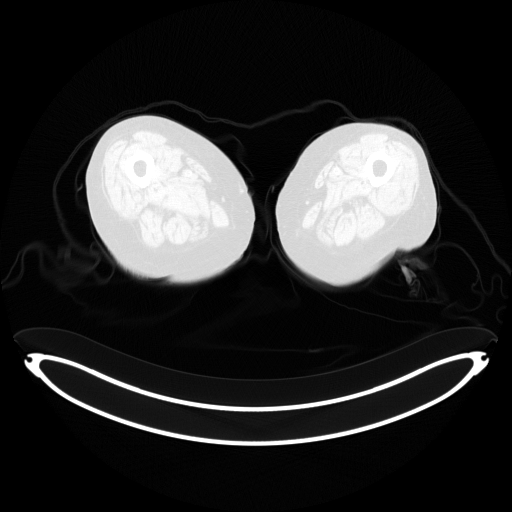
[im 102/204]
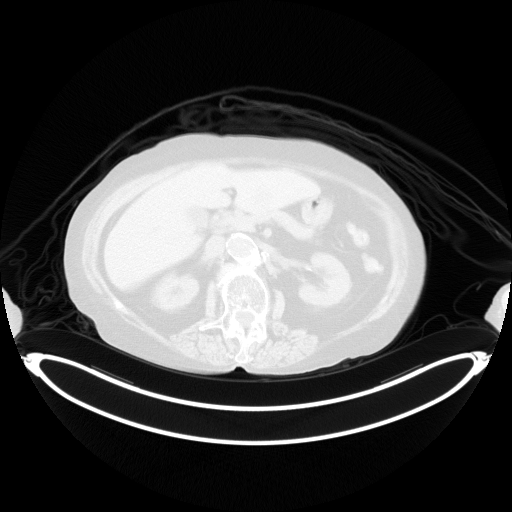
[im 153/204]
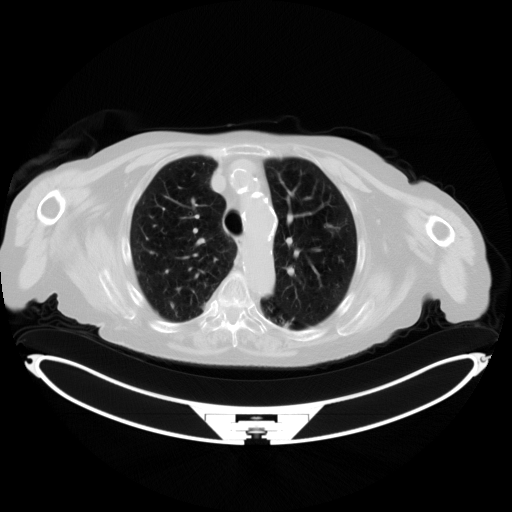

[Series 5: pet sk_thigh nac · axial · 5.0mm · 4.07mm/px · z∈[-673,+139]mm · 4 of 204 slices shown]
[im 1/204]
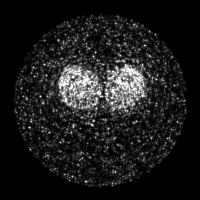
[im 51/204]
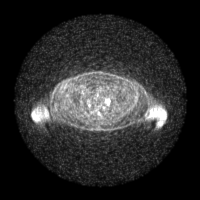
[im 102/204]
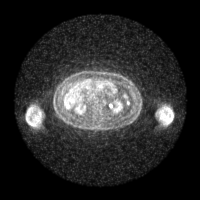
[im 204/204]
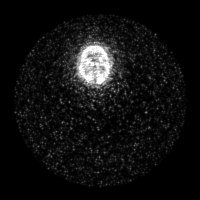

[Series 8: ct sk_thigh 5.0 b70f lung_bone · axial · 5.0mm · 0.70mm/px · z∈[-238,+6]mm · 2 of 62 slices shown]
[im 1/62  bone]
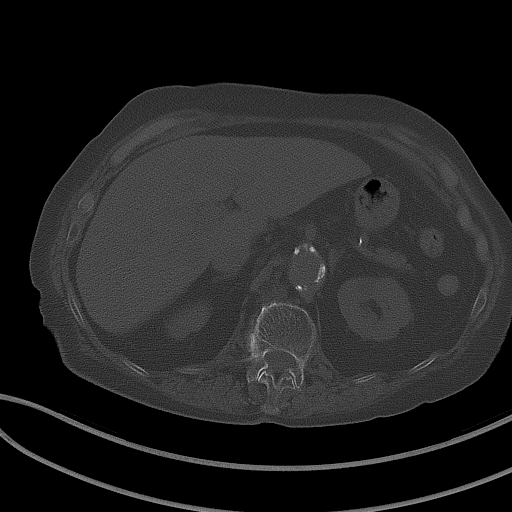
[im 62/62  bone]
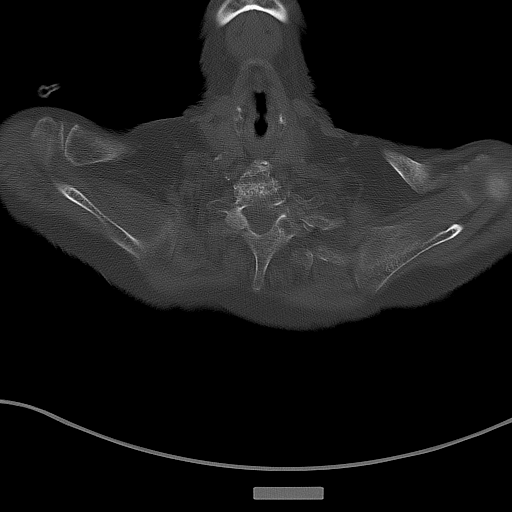

[Series 604: range-ct sk_thigh 5.0 (id)<alpha range> · 2 of 79 slices shown (1 of 2)]
[im 1/79]
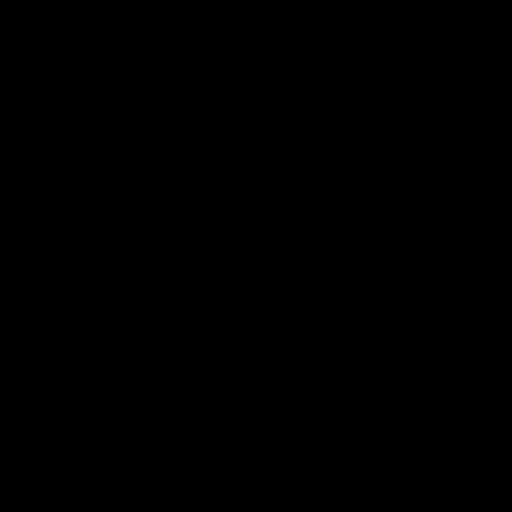
[im 79/79]
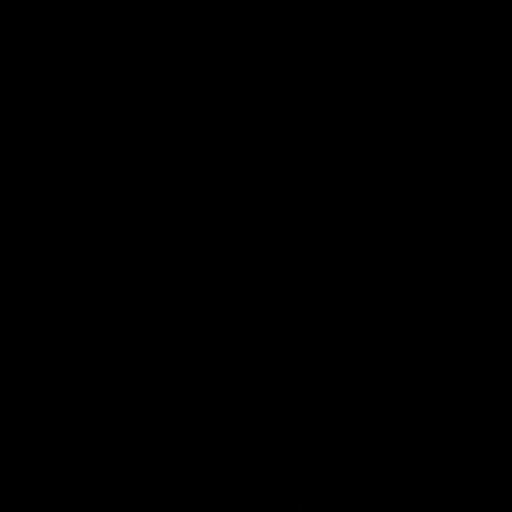

[Series 605: range-ct sk_thigh 5.0 (id)<alpha range> · 3 of 195 slices shown (2 of 2)]
[im 49/195]
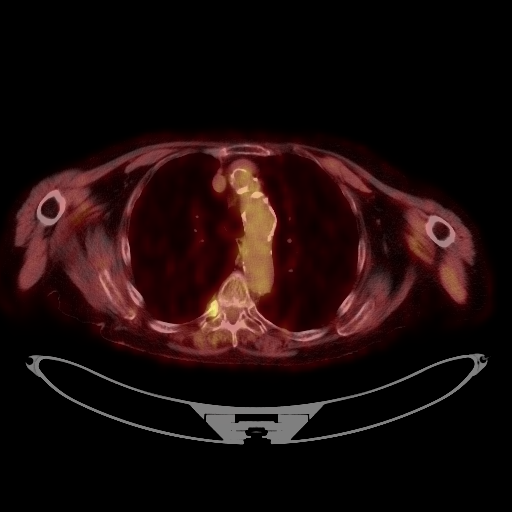
[im 98/195]
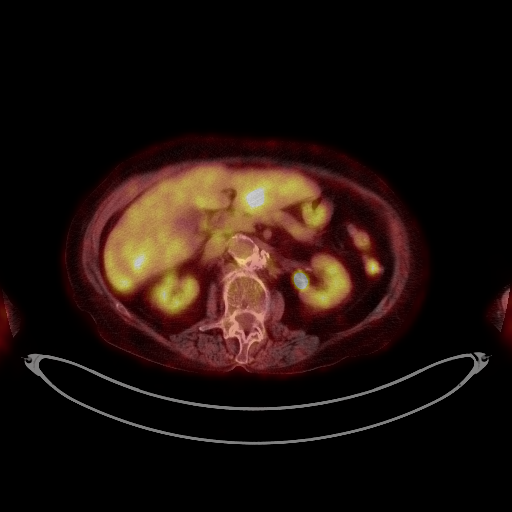
[im 146/195]
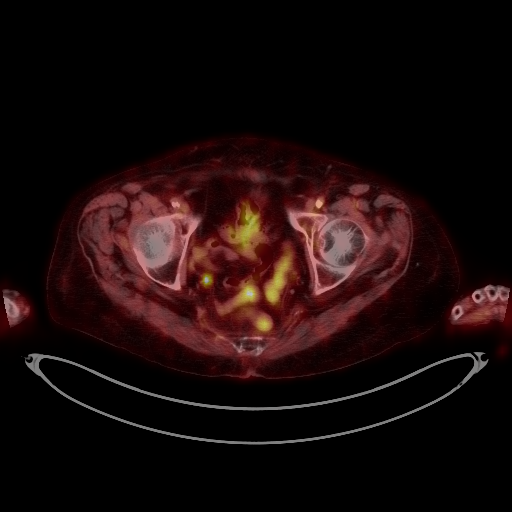

[Series 1072: results mm oncology reading · 1.0mm · 0.50mm/px · 1 of 16 slices shown]
[im 1/16]
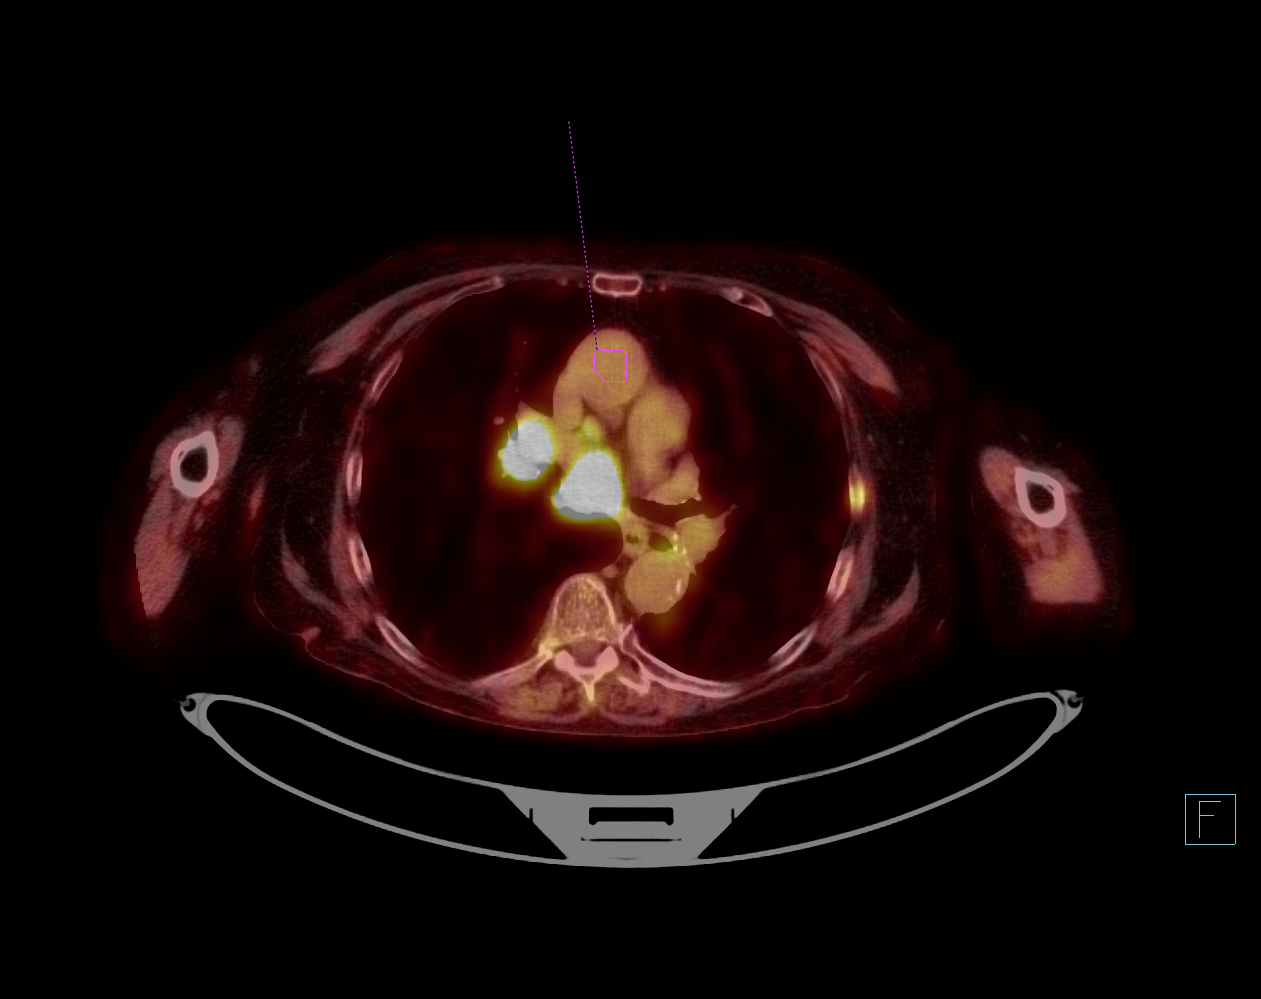

[18 of 25 positions shown; findings below may reference images not displayed]

FINDINGS: Mediastinal blood pool activity: SUV max 2.74.

NECK: FDG positive nodule in the right lobe of thyroid gland is
identified within SUV max of 4.22. No hypermetabolic cervical lymph
nodes.

Incidental CT findings: none

CHEST: No hypermetabolic axillary or supraclavicular lymph nodes.

Multiple hypermetabolic mediastinal and hilar lymph nodes are
identified.

-index right paratracheal lymph node measures 1.4 cm within SUV max
of 14.9.

-index subcarinal lymph node measures 3.7 cm within SUV max of 15.0.

-index right hilar node measures 3.6 cm within SUV max of 17.07.

Azygos esophageal lymph node between the IVC and distal descending
thoracic aorta measures 2.3 cm and has an SUV max of 14.2.

There is no pleural effusion identified bilaterally.

-index 5 mm nodule in the left upper lobe is identified within SUV
max of 1.69. Ovoid nodule within the posteromedial right base
measures 1.5 cm and has an SUV max of 4.29. Small peripheral nodule
within the lateral right lower lobe measures 6 mm within SUV max of
0.9.

Incidental CT findings: Moderate to advanced changes of
centrilobular emphysema with diffuse bronchial wall thickening.
Extensive aortic atherosclerosis. Aneurysmal dilatation of the
aortic arch measures 3.1 cm. Tortuous descending thoracic aorta is
also aneurysmally dilated measuring 4.2 cm in diameter. Coronary
artery calcifications. There is chronic appearing subsegmental
atelectasis involving the right middle lobe. Bronchiectasis and
patchy peripheral airspace consolidation with tree-in-bud nodularity
is identified within the posterior left lung base. Scattered small
pulmonary nodules are identified.

ABDOMEN/PELVIS: Multifocal hypermetabolic liver lesions are
identified compatible with metastatic disease. Lesions involve both
lobes of liver.

-index lesion within posterior left lobe measures 2 cm and has an
SUV max of 9.52.

-Index lesion within the posterior right lobe of liver measures
cm within SUV max of 5.6.

-Medial right lobe of liver metastasis measures 1.9 cm within SUV
max of 7.21.

No abnormal uptake identified within the liver or spleen. No
abnormal uptake within the adrenal glands. No hypermetabolic lymph
nodes within the abdomen or pelvis.

Incidental CT findings: Extensive aortic atherosclerosis. The
infrarenal abdominal aorta measures 2.9 cm in maximum AP dimension.
Bilateral fat containing inguinal hernias identified.

SKELETON: Multifocal osseous metastases identified. These are best
seen on the PET images and are essentially occult on corresponding
CT images.

-There is diffuse uptake identified within the lower sacrum which
has an SUV max of 6.13.

-More focal uptake within the posterior right iliac bone has an SUV
max of 11.39.

-Right lateral iliac wing lesion has an SUV max of 6.71.

-Left lateral rib lesion is noted within SUV max of 3.92.

Incidental CT findings: T9 compression deformity within the lower
thoracic spine is again noted. There are associated fracture
deformities extending through the spinous processes of T7, T8 and
T9.
IMPRESSION: 1. Extensive hypermetabolic adenopathy is identified within the
mediastinum and right hilar region. Imaging findings are concerning
for primary bronchogenic carcinoma.
2. Multifocal hypermetabolic liver metastases.
3. Multifocal hypermetabolic bone metastasis predominantly involving
the bony pelvis.
4. Atelectasis in the right middle lobe and postinflammatory changes
in the posterior left base are identified. A few scattered nodules
are identified in both lungs which exhibit mild FDG uptake.
Pulmonary metastasis not excluded.
5. Extensive aortic atherosclerosis with aneurysmal dilatation of
the aortic arch, descending thoracic aorta. Thoracic surgery
consultation may be considered.
6. Coronary artery calcifications
7. Age-indeterminate T9 compression deformity with nondisplaced
fractures involving the spinous processes of T7, T8 and T9.

Aortic Atherosclerosis (VOFGM-9NE.E) and Emphysema (VOFGM-3IK.M).

These results will be called to the ordering clinician or
representative by the Radiologist Assistant, and communication
documented in the PACS or zVision Dashboard.
